# Patient Record
Sex: Female | Born: 1965 | Race: White | Hispanic: No | Marital: Married | Smoking: Never smoker
Health system: Southern US, Community
[De-identification: ages and names within clinical notes are randomized; demographics above are authoritative.]

## PROBLEM LIST (undated history)

## (undated) DIAGNOSIS — C439 Malignant melanoma of skin, unspecified: Secondary | ICD-10-CM

## (undated) DIAGNOSIS — I471 Supraventricular tachycardia, unspecified: Secondary | ICD-10-CM

## (undated) DIAGNOSIS — R7989 Other specified abnormal findings of blood chemistry: Secondary | ICD-10-CM

## (undated) DIAGNOSIS — G43909 Migraine, unspecified, not intractable, without status migrainosus: Secondary | ICD-10-CM

## (undated) DIAGNOSIS — E119 Type 2 diabetes mellitus without complications: Secondary | ICD-10-CM

## (undated) DIAGNOSIS — K769 Liver disease, unspecified: Secondary | ICD-10-CM

## (undated) DIAGNOSIS — R945 Abnormal results of liver function studies: Secondary | ICD-10-CM

## (undated) DIAGNOSIS — F419 Anxiety disorder, unspecified: Secondary | ICD-10-CM

## (undated) DIAGNOSIS — C4362 Malignant melanoma of left upper limb, including shoulder: Secondary | ICD-10-CM

## (undated) DIAGNOSIS — K760 Fatty (change of) liver, not elsewhere classified: Secondary | ICD-10-CM

## (undated) DIAGNOSIS — K589 Irritable bowel syndrome without diarrhea: Secondary | ICD-10-CM

## (undated) HISTORY — PX: MOLE REMOVAL: SHX2046

## (undated) HISTORY — DX: Fatty (change of) liver, not elsewhere classified: K76.0

## (undated) HISTORY — DX: Other specified abnormal findings of blood chemistry: R79.89

## (undated) HISTORY — DX: Malignant melanoma of skin, unspecified: C43.9

## (undated) HISTORY — DX: Liver disease, unspecified: K76.9

## (undated) HISTORY — DX: Type 2 diabetes mellitus without complications: E11.9

## (undated) HISTORY — DX: Abnormal results of liver function studies: R94.5

## (undated) HISTORY — PX: APPENDECTOMY: SHX54

## (undated) HISTORY — DX: Irritable bowel syndrome, unspecified: K58.9

## (undated) HISTORY — PX: TUBAL LIGATION: SHX77

## (undated) HISTORY — PX: OTHER SURGICAL HISTORY: SHX169

---

## 2002-05-27 ENCOUNTER — Encounter: Payer: Self-pay | Admitting: Internal Medicine

## 2002-05-27 ENCOUNTER — Encounter: Admission: RE | Admit: 2002-05-27 | Discharge: 2002-05-27 | Payer: Self-pay | Admitting: Internal Medicine

## 2003-05-29 ENCOUNTER — Encounter: Admission: RE | Admit: 2003-05-29 | Discharge: 2003-05-29 | Payer: Self-pay | Admitting: Internal Medicine

## 2003-05-29 ENCOUNTER — Encounter: Payer: Self-pay | Admitting: Internal Medicine

## 2004-12-01 HISTORY — PX: BREAST SURGERY: SHX581

## 2004-12-30 ENCOUNTER — Encounter: Admission: RE | Admit: 2004-12-30 | Discharge: 2004-12-30 | Payer: Self-pay | Admitting: Internal Medicine

## 2005-06-16 ENCOUNTER — Encounter: Admission: RE | Admit: 2005-06-16 | Discharge: 2005-06-16 | Payer: Self-pay | Admitting: Internal Medicine

## 2005-06-26 IMAGING — CT CT HEAD WO/W CM
1 of 2 series · 13 of 30 positions shown, 17 images · IV contrast (agent unspecified)
Comparison: None.

CLINICAL DATA: Headaches.

CRANIAL CT WITHOUT AND WITH CONTRAST 12/30/2004:
TECHNIQUE: 5 mm axial images were obtained from the skull base through the brain
to the vertex both before and after the administration of intravenous contrast.
Contrast: 75 cc [id number].

[Series 2: brain · axial · 0.49mm/px · z∈[+12,+134]mm · 13 of 28 slices shown, 17 images]
[im 2/28  brain]
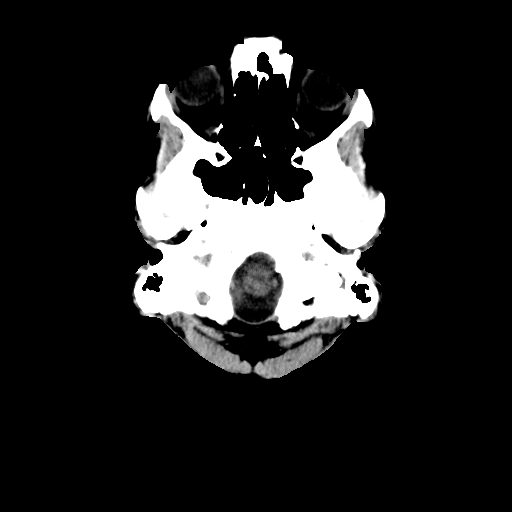
[im 2/28  bone]
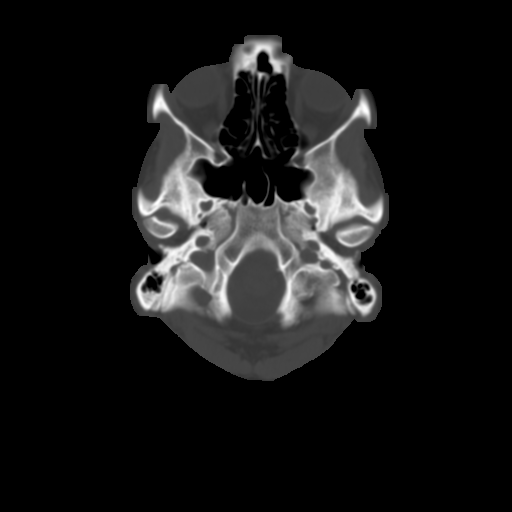
[im 4/28  brain]
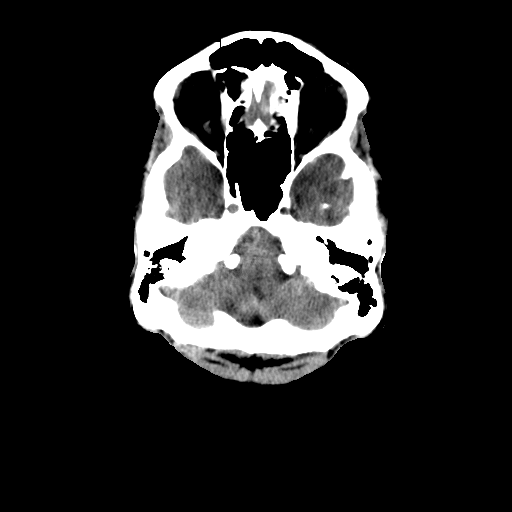
[im 6/28  brain]
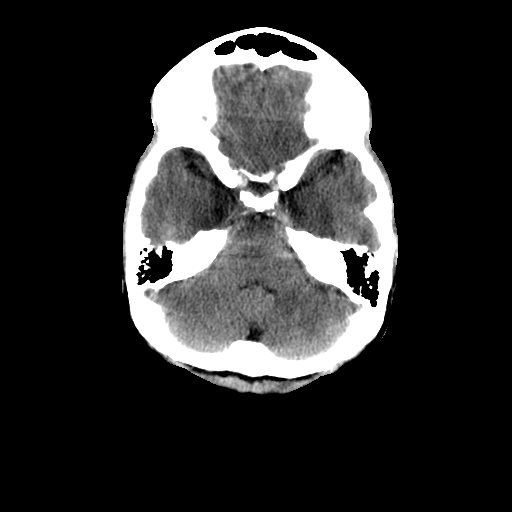
[im 8/28  brain]
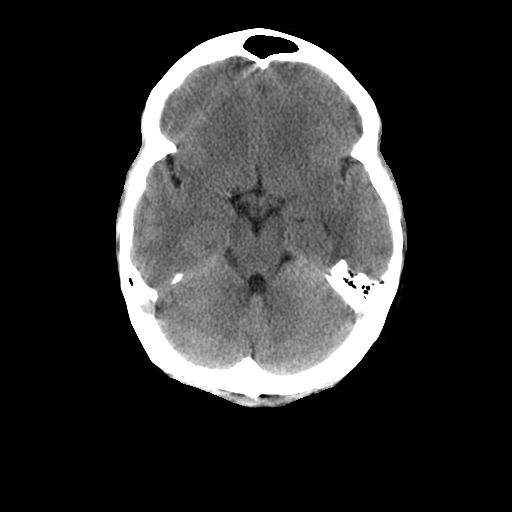
[im 10/28  brain]
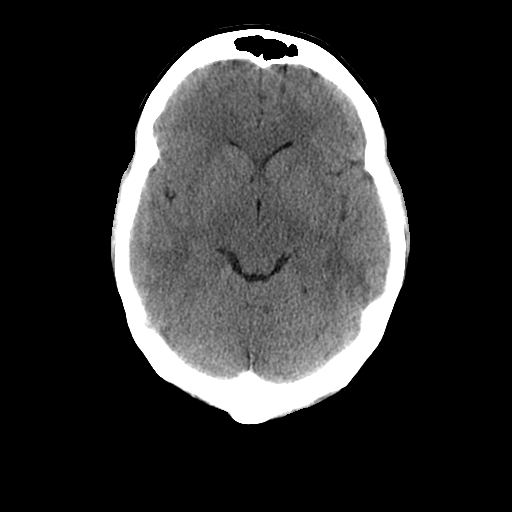
[im 10/28  bone]
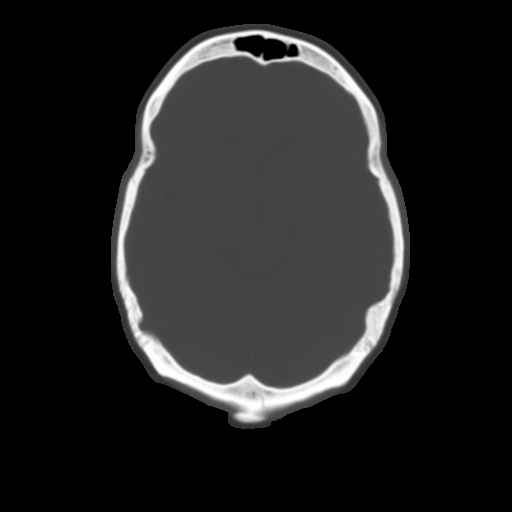
[im 12/28  brain]
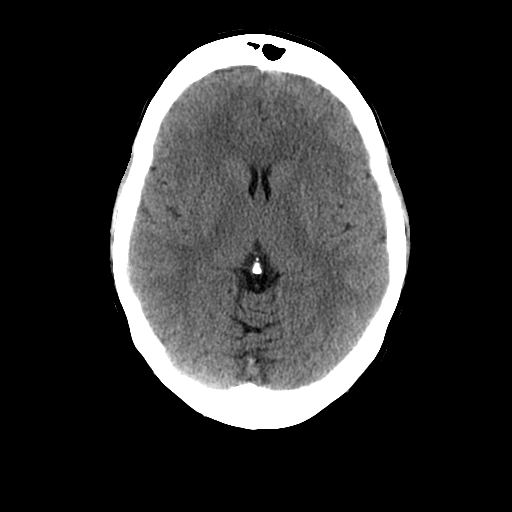
[im 14/28  brain]
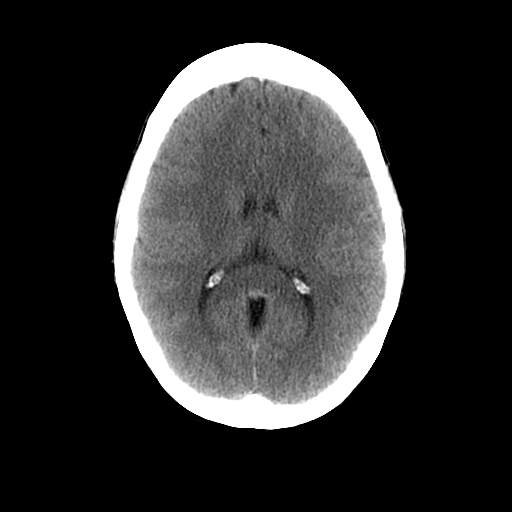
[im 16/28  brain]
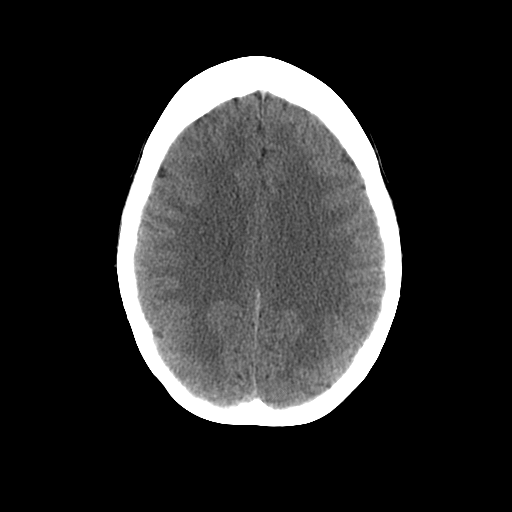
[im 18/28  brain]
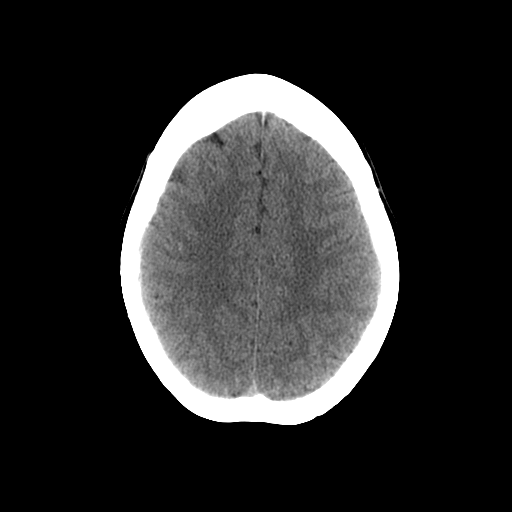
[im 18/28  bone]
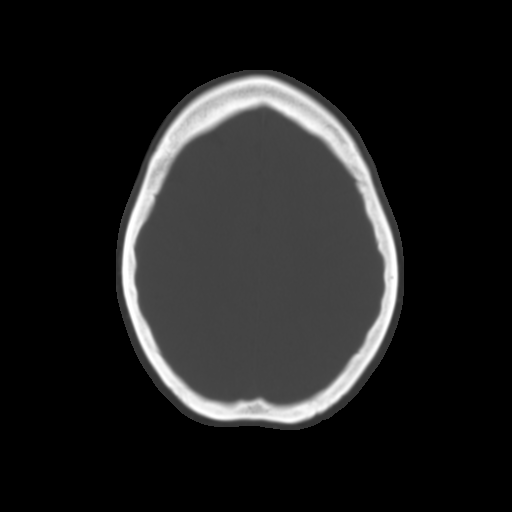
[im 20/28  brain]
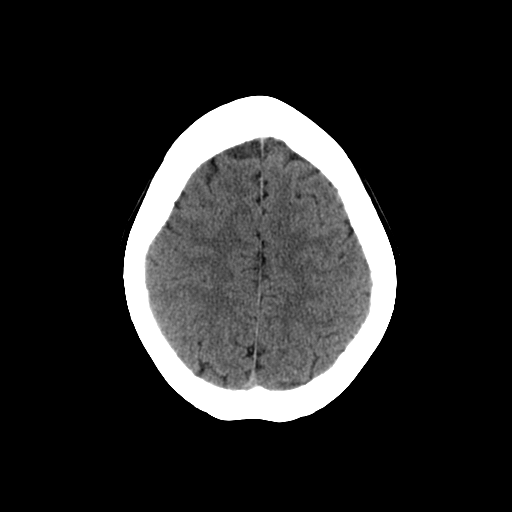
[im 22/28  brain]
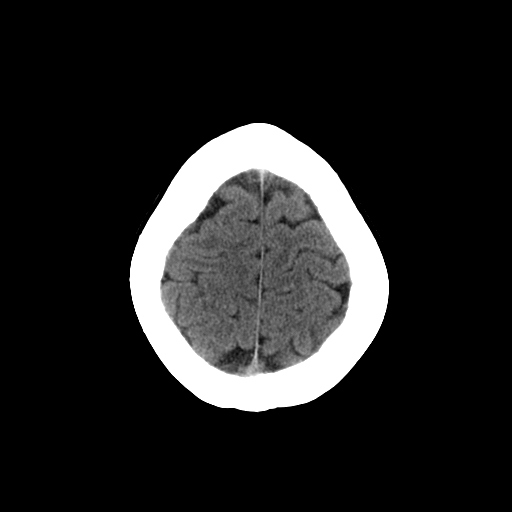
[im 24/28  brain]
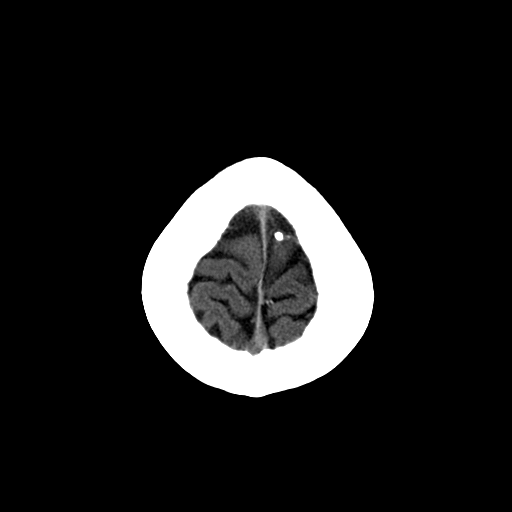
[im 26/28  brain]
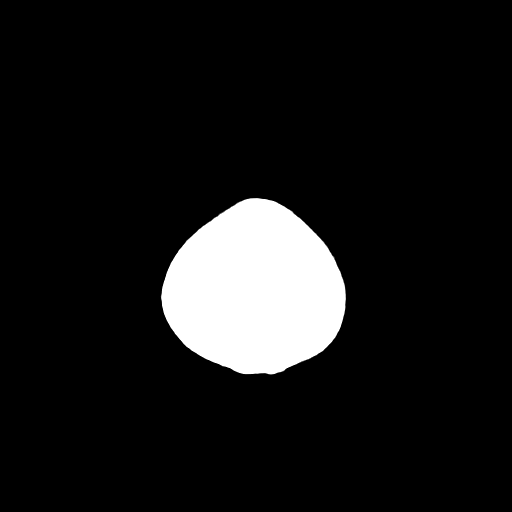
[im 26/28  bone]
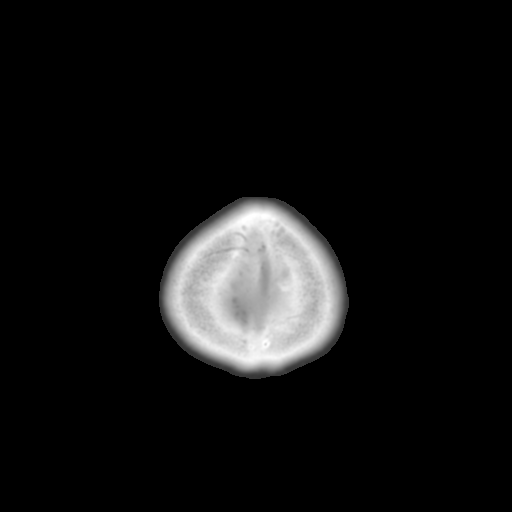

[13 of 30 positions shown; findings below may reference images not displayed]

FINDINGS: The ventricular system is normal in size and appearance for age.
There is no mass-effect or midline shift. There is no hemorrhage or hematoma. No
extra-axial fluid collections are identified. The post-contrast images
demonstrate no abnormal enhancement. I see no focal brain parenchymal
abnormalities.

The bone window images demonstrate no focal osseous abnormalities involving the
skull. The visualized paranasal sinuses and the mastoid air cells appear
well-aerated.
IMPRESSION: Normal cranial CT without and with contrast.

## 2005-12-01 HISTORY — PX: AUGMENTATION MAMMAPLASTY: SUR837

## 2006-09-12 ENCOUNTER — Emergency Department (HOSPITAL_COMMUNITY): Admission: EM | Admit: 2006-09-12 | Discharge: 2006-09-12 | Payer: Self-pay | Admitting: Family Medicine

## 2007-06-18 ENCOUNTER — Encounter: Admission: RE | Admit: 2007-06-18 | Discharge: 2007-06-18 | Payer: Self-pay | Admitting: Internal Medicine

## 2007-06-28 ENCOUNTER — Encounter: Admission: RE | Admit: 2007-06-28 | Discharge: 2007-06-28 | Payer: Self-pay | Admitting: Internal Medicine

## 2008-07-04 ENCOUNTER — Encounter: Admission: RE | Admit: 2008-07-04 | Discharge: 2008-07-04 | Payer: Self-pay | Admitting: Internal Medicine

## 2008-09-30 ENCOUNTER — Observation Stay (HOSPITAL_COMMUNITY): Admission: EM | Admit: 2008-09-30 | Discharge: 2008-10-01 | Payer: Self-pay | Admitting: *Deleted

## 2008-09-30 ENCOUNTER — Encounter: Payer: Self-pay | Admitting: Internal Medicine

## 2008-10-02 ENCOUNTER — Encounter: Payer: Self-pay | Admitting: Internal Medicine

## 2008-10-06 ENCOUNTER — Observation Stay (HOSPITAL_COMMUNITY): Admission: EM | Admit: 2008-10-06 | Discharge: 2008-10-06 | Payer: Self-pay | Admitting: Emergency Medicine

## 2008-10-10 ENCOUNTER — Encounter: Payer: Self-pay | Admitting: Internal Medicine

## 2008-10-24 ENCOUNTER — Encounter: Payer: Self-pay | Admitting: Internal Medicine

## 2008-11-10 ENCOUNTER — Encounter: Payer: Self-pay | Admitting: Internal Medicine

## 2008-12-17 ENCOUNTER — Emergency Department (HOSPITAL_COMMUNITY): Admission: EM | Admit: 2008-12-17 | Discharge: 2008-12-17 | Payer: Self-pay | Admitting: Family Medicine

## 2009-02-09 ENCOUNTER — Encounter: Admission: RE | Admit: 2009-02-09 | Discharge: 2009-02-09 | Payer: Self-pay | Admitting: Internal Medicine

## 2009-03-14 ENCOUNTER — Inpatient Hospital Stay (HOSPITAL_BASED_OUTPATIENT_CLINIC_OR_DEPARTMENT_OTHER): Admission: RE | Admit: 2009-03-14 | Discharge: 2009-03-14 | Payer: Self-pay | Admitting: Cardiology

## 2009-03-27 ENCOUNTER — Encounter: Admission: RE | Admit: 2009-03-27 | Discharge: 2009-03-27 | Payer: Self-pay | Admitting: Internal Medicine

## 2009-03-27 DIAGNOSIS — K760 Fatty (change of) liver, not elsewhere classified: Secondary | ICD-10-CM

## 2009-03-27 HISTORY — DX: Fatty (change of) liver, not elsewhere classified: K76.0

## 2009-05-03 DIAGNOSIS — R079 Chest pain, unspecified: Secondary | ICD-10-CM | POA: Insufficient documentation

## 2009-05-03 DIAGNOSIS — R002 Palpitations: Secondary | ICD-10-CM | POA: Insufficient documentation

## 2009-05-03 DIAGNOSIS — F411 Generalized anxiety disorder: Secondary | ICD-10-CM | POA: Insufficient documentation

## 2009-05-03 DIAGNOSIS — I471 Supraventricular tachycardia: Secondary | ICD-10-CM | POA: Insufficient documentation

## 2009-05-04 ENCOUNTER — Ambulatory Visit: Payer: Self-pay | Admitting: Internal Medicine

## 2009-07-05 ENCOUNTER — Telehealth: Payer: Self-pay | Admitting: Internal Medicine

## 2009-07-06 ENCOUNTER — Encounter: Admission: RE | Admit: 2009-07-06 | Discharge: 2009-07-06 | Payer: Self-pay | Admitting: Internal Medicine

## 2009-07-24 ENCOUNTER — Telehealth: Payer: Self-pay | Admitting: Internal Medicine

## 2009-10-11 ENCOUNTER — Telehealth: Payer: Self-pay | Admitting: Internal Medicine

## 2009-12-01 DIAGNOSIS — C439 Malignant melanoma of skin, unspecified: Secondary | ICD-10-CM

## 2009-12-01 HISTORY — DX: Malignant melanoma of skin, unspecified: C43.9

## 2010-07-15 ENCOUNTER — Ambulatory Visit: Payer: Self-pay | Admitting: Internal Medicine

## 2010-07-17 ENCOUNTER — Encounter: Admission: RE | Admit: 2010-07-17 | Discharge: 2010-07-17 | Payer: Self-pay | Admitting: Internal Medicine

## 2010-12-01 DIAGNOSIS — K769 Liver disease, unspecified: Secondary | ICD-10-CM

## 2010-12-01 HISTORY — DX: Liver disease, unspecified: K76.9

## 2010-12-21 ENCOUNTER — Encounter: Payer: Self-pay | Admitting: Internal Medicine

## 2010-12-22 ENCOUNTER — Encounter: Payer: Self-pay | Admitting: Internal Medicine

## 2010-12-31 NOTE — Assessment & Plan Note (Signed)
Summary: f1y/jss   Visit Type:  Follow-up Referring Provider:  Corliss Marcus, MD Primary Provider:  Waynard Edwards, MD   History of Present Illness: The patient presents today for routine electrophysiology followup. She reports doing very well since last being seen in our clinic.  She has not had SVT since 8/10.  She feels that her anxiety has significantly improved with lexapro.  She has found that treatment of anxiety has improved atypical chest pain. The patient denies symptoms of palpitations, chest pain, shortness of breath, orthopnea, PND, lower extremity edema, dizziness, presyncope, syncope, or neurologic sequela. The patient is tolerating medications without difficulties and is otherwise without complaint today.   Current Medications (verified): 1)  Atenolol 25 Mg Tabs (Atenolol) .... As Needed 2)  Alprazolam .... As Needed 3)  Frova 2.5 Mg Tabs (Frovatriptan Succinate) .... As Needed 4)  Topamax 25 Mg Tabs (Topiramate) .... 4 Once Daily 5)  Ibuprofen 600 Mg Tabs (Ibuprofen) .... As Needed 6)  Valtrex 1 Gm Tabs (Valacyclovir Hcl) .... As Needed 7)  Multivitamins   Tabs (Multiple Vitamin) .Marland Kitchen.. 1 By Mouth Once Daily 8)  Riboflavin 100 Mg Caps (Riboflavin) .... 400mg  Once Daily 9)  Magnesium 500 Mg Caps (Magnesium Oxide) .Marland Kitchen.. 1 By Mouth Once Daily 10)  Lexapro 10 Mg Tabs (Escitalopram Oxalate) .... Once Daily 11)  Baclofen 10 Mg Tabs (Baclofen) .... As Needed 12)  Flexeril 10 Mg Tabs (Cyclobenzaprine Hcl) .... As Needed 13)  Loseasonique 0.1-0.02 & 0.01 Mg Tabs (Levonorgest-Eth Estrad 91-Day) .... Uad 14)  Vitamin D 1000 Unit Tabs (Cholecalciferol) .... Once Daily  Allergies: 1)  ! * No Ivp Dye Allergy 2)  ! * No Latex Allergy 3)  ! * No Shellfish Allergy  Past History:  Past Medical History: Reviewed history from 05/04/2009 and no changes required. SVT 09/30/08 ANXIETY Atypical chest pain S/p LHC 4/10 showing normal coronary arteries, nl ef Migraines  Past Surgical  History: Reviewed history from 05/04/2009 and no changes required. breast augmentation 2007 C section x 2 (1990, 1993)  Social History: Reviewed history from 05/04/2009 and no changes required. Lives in [place].  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  Alcohol Use -  rare Drug Use - no  Vital Signs:  Patient profile:   45 year old female Height:      69 inches Weight:      189 pounds BMI:     28.01 Pulse rate:   76 / minute BP sitting:   120 / 88  (left arm)  Vitals Entered By: Laurance Flatten CMA (July 15, 2010 4:55 PM)  Physical Exam  General:  Well developed, well nourished, in no acute distress. Head:  normocephalic and atraumatic Eyes:  PERRLA/EOM intact; conjunctiva and lids normal. Mouth:  Teeth, gums and palate normal. Oral mucosa normal. Neck:  Neck supple, no JVD. No masses, thyromegaly or abnormal cervical nodes. Lungs:  Clear bilaterally to auscultation and percussion. Heart:  Non-displaced PMI, chest non-tender; regular rate and rhythm, S1, S2 without murmurs, rubs or gallops. Carotid upstroke normal, no bruit. Normal abdominal aortic size, no bruits. Femorals normal pulses, no bruits. Pedals normal pulses. No edema, no varicosities. Abdomen:  Bowel sounds positive; abdomen soft and non-tender without masses, organomegaly, or hernias noted. No hepatosplenomegaly. Msk:  Back normal, normal gait. Muscle strength and tone normal. Pulses:  pulses normal in all 4 extremities Extremities:  No clubbing or cyanosis. Neurologic:  Alert and oriented x 3.   EKG  Procedure  date:  07/15/2010  Findings:      sinus rhythnm 76 bpm, PR 156, nonspecific ST/T changes  Impression & Recommendations:  Problem # 1:  SVT/ PSVT/ PAT (ICD-427.0) stable off medicine pt wishes to defer ablation she will take atenolol 25mg  as needed for SVT  Problem # 2:  CHEST PAIN-UNSPECIFIED (ICD-786.50) improved with treatment of anxiety  Problem # 3:   ANXIETY (ICD-300.00) improved with lexapro  Patient Instructions: 1)  Your physician recommends that you schedule a follow-up appointment in: 12 months with Dr Johney Frame 2)  Your physician recommends that you continue on your current medications as directed. Please refer to the Current Medication list given to you today. Prescriptions: ATENOLOL 25 MG TABS (ATENOLOL) as needed  #30 x 3   Entered by:   Dennis Bast, RN, BSN   Authorized by:   Hillis Range, MD   Signed by:   Dennis Bast, RN, BSN on 07/15/2010   Method used:   Electronically to        [facility]* (retail)       [address] [place], [place]  [address]       Ph: [phone] or [phone]       Fax: [phone]   RxID:   [id number]

## 2011-04-15 NOTE — H&P (Signed)
Alexa [name], Alexa [name]                 ACCOUNT NO.:  [id number]   MEDICAL RECORD NO.:  [id number]          PATIENT TYPE:  OBV   LOCATION:  2029                         FACILITY:  [facility]   PHYSICIAN:  Ladell Pier, M.D.   DATE OF BIRTH:  09/05/1966   DATE OF ADMISSION:  10/05/2008  DATE OF DISCHARGE:                              HISTORY & PHYSICAL   CHIEF COMPLAINT:  Chest pain and dizziness.   HISTORY OF PRESENT ILLNESS:  The patient is a 45 year old white female  who presented to the emergency room with chest pain, dizziness, and left  arm numbness.  The patient stated that 5 days ago she was in the  hospital. She was diagnosed with SVT with her heart rate going into the  190's and 170s.  She saw Dr. Amil Amen.  She followed up 4 days ago for a  2-D echo that was normal.  She was placed on atenolol for the SVT.  The  patient stated that she just has not been feeling right lately.  She  states that she has these episodes where her heart is not racing any  more, but she has episodes as if she is going to pass out.  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  Now she has tightness in the center of her chest that radiates  over towards the left arm and her left arm feels in all the way up to  her chest.  She states there is a lot of pressure in the center of her  chest going more over to the left side.  She stated that with the chest  pressure it has lasted all day and she had some diaphoresis with it at  one time but no nausea or vomiting.  She does get short of breath.   PAST MEDICAL HISTORY:  Significant for migraine headaches, recently  diagnosed SVT. She has had 2 C sections, and she has had breast  augmentation in 2007.   FAMILY HISTORY:  Mother has osteoarthritis.  She is 70.  Father is 46  and had heart disease and Afib.   SOCIAL HISTORY:  She does not smoke.  She drinks alcohol socially.  She  is married.  She has two children, 15  and 19.  She is a Engineer, site.  No IV drug use.   MEDICATIONS:  1. Ibuprofen 600 mg p.r.n.  2. Valium 5 mg daily as needed.  3. Loestrin 1/20  oral contraceptives  4. Topamax 50 mg twice daily.  5. Imitrex 100 mg as needed.  6. Atenolol 50 mg daily.  7. Vicodin 10 mg as needed.   ALLERGIES:  No known drug allergies.   REVIEW OF SYSTEMS:  Negative otherwise stated in HPI.   PHYSICAL EXAM:  VITAL SIGNS:  Temperature 97.8, blood pressure 106/60,  pulse of 70, respirations 20, pulse ox 100%.  GENERAL: The patient is sitting on the stretcher, does not seem to be in  any acute distress.  Her husband is with her and mother and father.  HEENT: Normocephalic, atraumatic.  Pupils are equal, round, and reactive  to light. Throat without erythema.  CARDIOVASCULAR:  Regular rate and rhythm.  No murmurs, rubs or gallops.  No carotid bruits.  LUNGS: Clear bilaterally.  No wheeze, rhonchi, or rales.  ABDOMEN:  Soft, nontender, nondistended.  Positive bowel sounds.  EXTREMITIES: Without edema, 2+ DP pulse bilaterally.  NEUROLOGICAL:  Exam is nonfocal.   LABS:  Myoglobin 58, MB 1.2, troponin less than 0.05.  UA 3-6 WBCs, 0-2  RBCs, few bacteria.  Urinalysis trace leukocyte esterase. Magnesium 2.3.  Sodium 136, potassium 4, chloride 108, CO2 of 17, glucose 117, BUN 12,  creatinine 0.68, calcium 9.2. WBC 8.9, hemoglobin 15.  Platelets are  clumped.  D-dimer 0.29.  Head CT negative for evidence of acute infarct.  Chest x-ray negative for any acute findings. The patient's CT scan of  the chest is pending. The EKG question of anterior infarct, but  unchanged from previous EKG.   ASSESSMENT/PLAN:  1. Migraine headaches, none at present.  2. Supraventricular tachycardia.  3. Dizziness and chest pain with left arm numbness.  4. Question of urinary tract infection.  5. Question of metabolic acidosis.   Will admit the patient to the hospital and start her on KVO IV fluids,  get cardiac markers,  TSH, fasting lipid panel.  Continue the atenolol,  get a cardiology consult, and Dr. Amil Amen will see the patient in the  morning.      Ladell Pier, M.D.  Electronically Signed     NJ/MEDQ  D:  10/05/2008  T:  10/06/2008  Job:  [id number]   cc:   Francisca December, M.D.  Robertd, MD

## 2011-04-15 NOTE — Discharge Summary (Signed)
NAMESTEFFI, NOVIELLO                 ACCOUNT NO.:  [id number]   MEDICAL RECORD NO.:  [id number]          PATIENT TYPE:  OBV   LOCATION:  2029                         FACILITY:  [facility]   PHYSICIAN:  Marcellus Scott, MD     DATE OF BIRTH:  [birth year]   DATE OF ADMISSION:  10/05/2008  DATE OF DISCHARGE:  10/06/2008                               DISCHARGE SUMMARY   PRIMARY MEDICAL DOCTOR'S:  Antony Madura, M.D.   PRIMARY CARDIOLOGIST:  Francisca December, M.D. of [facility].   DISCHARGE DIAGNOSES:  1. Atypical chest discomfort.  MI (myocardial infarction) and PE      (pulmonary embolus) ruled out.  2. History of supraventricular tachycardia.  3. History of migraine.  4. Fatty liver with mildly abnormal liver function test.  5. Urinary tract infection/uncomplicated cystitis.   DISCHARGE MEDICATIONS:  1. Cipro 500 mg p.o. b.i.d. for 3 days.  2. Ibuprofen 600 mg p.o. 3 times a day with meals p.r.n.  3. Valium 5 mg p.o. daily p.r.n.  4. Oral contraceptive pills Loestrin 1/20.  5. Topamax 50 mg p.o. b.i.d.  6. Imitrex 100 mg p.o. p.r.n.  7. Atenolol 50 mg p.o. daily.  8. Baclofen 10 mg p.o. p.r.n.   PROCEDURES:  1. CT angiogram of the chest on October 05, 2008, impression - no      evidence of pulmonary embolism; no acute process in the chest,      fatty infiltration of the liver.  2. Chest x-ray on October 05, 2008, impression - negative.  3. CT of the head without contrast on October 05, 2008, impression -      negative.   PERTINENT LABS:  Cardiac enzymes cycled and negative.  Comprehensive  metabolic panel remarkable for BUN 9, creatinine 0.86, AST 94, ALT 86,  TSH 1.988, CBCs within normal limits, hemoglobin 14.1, hematocrit 42.4,  white blood cell 6.7, platelets 264.  Lipid panel within normal limits  with cholesterol 150, triglycerides 91, HDL 49, LDL 83, VLDL 18, D-dimer  negative.  Urinalysis with 3-6 white blood cells per high-power field  and a few bacteria.   CONSULTATIONS:  Cardiology, Dr. Anne Fu of [facility].   HOSPITAL COURSE AND PATIENT DISPOSITION:  Ms. 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  She was discharged home on oral atenolol.  She had an echocardiogram at  her cardiologist's office early this week which was said to be normal.  She indicates that since she was discharged she has been unwell with  intermittent episodes of feeling weak, dizzy.  Since yesterday afternoon  while at work she experienced anterior chest pressure with radiation to  left upper extremity with left upper extremity numbness, dizziness and  feeling like she was going to pass out.  She denies any history of  palpitations.  She thereby came to the emergency room and was admitted  for observation and evaluation.   1. Atypical chest pressure.  The patient was admitted to telemetry  with no arrhythmia alarms.  Her cardiac enzymes were cycled and      negative.  PE was ruled out.  The patient indicates that she has      minimal intermittent chest pressure but no more numbness of the      left upper extremity.  Her EKG this morning is normal sinus rhythm      with no acute or chronic ischemic changes..  The patient has no      history of heavy alcohol intake or smoking.  She does have family      history of coronary artery disease.  Her father underwent coronary      artery bypass graft at age 65.  Cardiology was consulted who have      cleared her for discharge and have arranged for an outpatient      stress test on October 10, 2008, at 12 p.m.  The patient will also      be provided with a 30 day event monitor to monitor for arrhythmias.  2. History of supraventricular tachycardia.  No further events on      monitoring in the hospital.  The patient is to continue her      atenolol.  3. History of migraines.  No headache on  this admission.  4. Fatty liver with slightly abnormal liver function tests.  To be      evaluated as an outpatient as deemed necessary.  5. Presumed urinary tract infection.  The patient indicates she has      frequency of urine but denies dysuria or chills.  Will treat with a      3 day course of ciprofloxacin.      Marcellus Scott, MD  Electronically Signed     AH/MEDQ  D:  10/06/2008  T:  10/06/2008  Job:  [id number]   cc:   Jake Bathe, MD  Antony Madura, M.D.  Francisca December, M.D.

## 2011-04-15 NOTE — Cardiovascular Report (Signed)
Alexa, [name]                 ACCOUNT NO.:  [id number]   MEDICAL RECORD NO.:  [id number]          PATIENT TYPE:  OIB   LOCATION:  1962                         FACILITY:  [facility]   PHYSICIAN:  Francisca December, M.D.  DATE OF BIRTH:  12-17-1965   DATE OF PROCEDURE:  03/14/2009  DATE OF DISCHARGE:  03/14/2009                            CARDIAC CATHETERIZATION   PROCEDURES PERFORMED:  1. Left heart catheterization.  2. Coronary angiography.  3. Left ventriculogram.   INDICATIONS:  Alexa [name] is a 45 year old woman who has had persistent  left and substernal chest discomfort both with and without exertion.  It  has been at times resolved with sublingual nitroglycerin, but also rest  and anxiolytics have helped.  She has a strong family history of  coronary disease.  A myocardial perfusion study with exercise stress in  November 2009 was normal.  However, chest discomfort has persisted.  Therefore, brought to the Cardiac Catheterization Laboratory at this  time to identify the extent of disease and provide further therapeutic  options.   PROCEDURE NOTE:  The patient was brought to Cardiac Catheterization  Laboratory in a fasting state.  The right groin was prepped and draped  in the usual sterile fashion.  Local anesthesia was obtained with  infiltration of 1% lidocaine.  A 4-French catheter sheath was inserted  percutaneously into the right femoral artery utilizing an anterior  approach over a guiding J-wire.  Left heart catheterization and coronary  angiography then proceeded in a standard fashion using 4-French #4 left  and right Judkins catheters and a 110-cm pigtail catheter.  A 30-degree  RAO cine left ventriculogram was performed utilizing power injector and  36 mL of contrast were injected at 12 mL/second.  Following the  sublingual administration of 0.4 mg nitroglycerin, cine angiography of  the right and left coronary arteries were conducted in multiple LAO and  RAO  projections.  At the completion of the procedure, the catheter and  catheter sheaths were removed.  Hemostasis was achieved by direct  pressure.  The patient was transported to the recovery area in stable  condition with an intact distal pulse.   HEMODYNAMIC RESULTS:  Systemic arterial pressure was 116/75 with a mean  of 94 mmHg.  There was no systolic gradient across the aortic valve.  The left ventricular end-diastolic pressure was 17 mmHg pre-  ventriculogram.   ANGIOGRAPHY:  The left ventriculogram demonstrated normal chamber size  and normal global systolic function without regional wall motion  abnormality.  A visual estimated ejection fraction is 65%.  There is no  coronary calcification seen.  There is no mitral regurgitation.  The  aortic valve is trileaflet and opens normally during systole.   There is a right-dominant coronary system present.  The main left  coronary artery is normal.   The left anterior descending artery and its branches are normal; there  is a large first diagonal branch which is the dominant vessel on the  anterolateral wall of the heart.  The ongoing anterior descending  reaches and traverses the apex.  There are no obstructions  seen  whatsoever in the LAD system.   The left circumflex coronary and its branches are without obstruction.  This vessel amounts to two moderate-sized marginal branches again  without any obstruction.   The right coronary and its branches are normal.  This is a large  dominant vessel with a large posterior descending artery and a large  posterolateral segment with a single large left ventricular branch.   Collateral vessels are not seen.   FINAL IMPRESSION:  1. Intact ventricular size and global systolic function, ejection      fraction 65%.  2. Normal coronary arteries.   PLAN/RECOMMENDATIONS:  The patient is presented with this gratifying  news.  Her chest pain clearly is not coronary ischemic in nature and she  has  an excellent prognosis.  We will continue medical therapy.      Francisca December, M.D.  Electronically Signed     JHE/MEDQ  D:  03/14/2009  T:  03/15/2009  Job:  [id number]   cc:   Antony Madura, M.D.

## 2011-04-15 NOTE — Consult Note (Signed)
Alexa [name], Alexa [name]                 ACCOUNT NO.:  [id number]   MEDICAL RECORD NO.:  [id number]          PATIENT TYPE:  OBV   LOCATION:  2029                         FACILITY:  [facility]   PHYSICIAN:  Jake Bathe, MD      DATE OF BIRTH:  1966-08-29   DATE OF CONSULTATION:  10/06/2008  DATE OF DISCHARGE:  10/06/2008                                 CONSULTATION   REASON FOR CONSULTATION:  Chest pain.   HISTORY OF PRESENT ILLNESS:  Ms. Alexa [name] is a 45 year old female who was  admitted over this past weekend complaining of palpitations.  She had  documented SVT (question atrial tachycardia).  She was placed on  atenolol 50 mg a day and has had no further reoccurrence of her SVT.  At  that point, she was kept overnight.  She ruled out for myocardial  infarction and was discharged to home with an echo planned for the next  day.   In speaking with Dr. Amil Amen, the echo was normal.  Yesterday, she had a  strange feeling and numbness all over including weakness and it sounds  like she was presyncopal. Lasted several hours in duration.  Just did  not feel right.  She denies any palpitations, no full syncope.  She did  have some chest discomfort that felt like a band across her chest.  She  denies any pleuritic component.  No palpitations.  She has no prior  history of significant anxiety.   PAST MEDICAL HISTORY:  Migraines.   SOCIAL HISTORY:  She lives in [place] with her husband, Theatre stage manager.  She is a Engineer, site.  She denies any tobacco, alcohol, or illicit  drug use.   FAMILY HISTORY:  Mother had osteoarthritis.  Father had bypass surgery  in his 65s.   ALLERGIES:  No known drug allergies.   MEDICATIONS:  1. Atenolol 50 mg a day.  2. Valium p.r.n.  3. Loestrin daily.  4. Topamax 50 mg b.i.d.  5. Imitrex p.r.n.  6. Vicodin p.r.n.   REVIEW OF SYSTEMS:  As above, otherwise 12 ROS negative. No frank  syncope, unilateral stroke like symptoms. No fevers, recent travel.   PHYSICAL EXAMINATION:  VITAL SIGNS:  Temperature 98.4, pulse 71,  respirations 18, blood pressure 94/61, and O2 saturations 98% on room  air.  GENERAL:  She is in no acute distress.  HEENT:  Grossly normal.  Sclerae clear.  Conjunctivae normal.  Nares  without drainage.  NECK:  No carotid or subclavian bruits.  No JVD or thyromegaly.  CHEST:  Clear to auscultation bilaterally.  No wheezing or rhonchi.  HEART:  Rate and rhythm regular.  No gallops or murmur.  ABDOMEN:  Good bowel sounds, nontender, and nondistended.  No  hepatosplenomegaly.  No bruits.  EXTREMITIES:  No peripheral edema.  SKIN:  Warm and dry.  NEUROLOGIC:  Cranial nerves II through XII grossly intact.  Normal mood  and affect.   LABORATORY DATA:  Chest CT showed no pulmonary embolism.  CT of the head  was negative.  Chest x-ray was normal.  Lab studies showed  hemoglobin  15, hematocrit 43.5, platelets were clumped, and white count 8.9.  Sodium 136, potassium 4.0, BUN 12, creatinine 0.68, and glucose 177.  Point-of-care markers negative x1.  Urinalysis did have some white blood  cells.  Magnesium 2.1.  D-dimer 0.29.   EKG shows normal sinus rhythm rate 69, within normal limits; no change  from prior.  Prior medical records reviewed.   ASSESSMENT AND PLAN:  1. Supraventricular tachycardia, recent admission for this.  There is      no further documented supraventricular tachycardia during this      hospitalization.  2. Chest pain, ruled out by enzymes/ ECG.  3. Migraines.   We will perform a stress Cardiolite next week in the office.  We will  also arrange for a 30-day event monitor given her recent SVT and  presyncope.  Extensive workup thus far, including neurologic, has been  unremakable except for SVT as above.  It was discussed with her and  husband that anxiety is possibly playing a role.  In the meantime, she  is to remain on her atenolol 50 mg a day.  She is instructed to stop her  atenolol on Sunday, so  we can do a stress test on Tuesday.  She is to  call if she has any further problems.  The patient was seen and examined  by Dr. Donato Schultz.      Guy Franco, P.A.      Jake Bathe, MD  Electronically Signed    LB/MEDQ  D:  10/06/2008  T:  10/06/2008  Job:  [id number]   cc:   Francisca December, M.D.

## 2011-07-07 ENCOUNTER — Other Ambulatory Visit: Payer: Self-pay | Admitting: Internal Medicine

## 2011-07-07 DIAGNOSIS — Z1231 Encounter for screening mammogram for malignant neoplasm of breast: Secondary | ICD-10-CM

## 2011-08-25 ENCOUNTER — Ambulatory Visit
Admission: RE | Admit: 2011-08-25 | Discharge: 2011-08-25 | Disposition: A | Discharge status: Home / Self Care | Payer: BC Managed Care – PPO | Source: Ambulatory Visit | Attending: Internal Medicine | Admitting: Internal Medicine

## 2011-08-25 DIAGNOSIS — Z1231 Encounter for screening mammogram for malignant neoplasm of breast: Secondary | ICD-10-CM

## 2011-09-01 LAB — CBC
HCT: 43.2
Hemoglobin: 14.3
MCHC: 33.2
MCV: 91.3
RDW: 12.8
WBC: 7.8

## 2011-09-01 LAB — TROPONIN I: Troponin I: <0.01

## 2011-09-01 LAB — BASIC METABOLIC PANEL
BUN: 13
Calcium: 9.1
Creatinine, Ser: 0.84
Potassium: 4
Sodium: 137

## 2011-09-01 LAB — DIFFERENTIAL
Basophils Absolute: 0
Eosinophils Absolute: 0

## 2011-09-01 LAB — RAPID URINE DRUG SCREEN, HOSP PERFORMED
Amphetamines: NOT DETECTED
Barbiturates: NOT DETECTED
Cocaine: NOT DETECTED
Tetrahydrocannabinol: NOT DETECTED

## 2011-09-01 LAB — CK TOTAL AND CKMB (NOT AT ARMC)
CK, MB: 0.9
Total CK: 73

## 2011-09-01 LAB — ETHANOL: Alcohol, Ethyl (B): <5

## 2011-09-01 LAB — TSH: TSH: 2.693

## 2011-09-02 LAB — URINALYSIS, ROUTINE W REFLEX MICROSCOPIC
Bilirubin Urine: NEGATIVE
Glucose, UA: NEGATIVE
Ketones, ur: NEGATIVE
Protein, ur: NEGATIVE
pH: 6

## 2011-09-02 LAB — CBC
HCT: 42.4
Hemoglobin: 15
MCHC: 34.5
MCV: 88.8
MCV: 90.7
Platelets: 264
RBC: 4.68
RDW: 12.6
WBC: 6.7

## 2011-09-02 LAB — COMPREHENSIVE METABOLIC PANEL
ALT: 86 — ABNORMAL HIGH
AST: 94 — ABNORMAL HIGH
Albumin: 3.6
CO2: 22
Calcium: 9.1
Chloride: 111
Creatinine, Ser: 0.86
GFR calc Af Amer: >60
GFR calc non Af Amer: >60
Sodium: 140
Total Bilirubin: 0.6

## 2011-09-02 LAB — CK TOTAL AND CKMB (NOT AT ARMC)
CK, MB: 0.8
Total CK: 55

## 2011-09-02 LAB — BASIC METABOLIC PANEL
CO2: 17 — ABNORMAL LOW
Calcium: 9.2
Chloride: 108
Creatinine, Ser: 0.68
Glucose, Bld: 117 — ABNORMAL HIGH

## 2011-09-02 LAB — TSH: TSH: 1.988

## 2011-09-02 LAB — POCT CARDIAC MARKERS
CKMB, poc: 1.2
Myoglobin, poc: 58
Troponin i, poc: <0.05

## 2011-09-02 LAB — DIFFERENTIAL
Basophils Absolute: 0.1
Eosinophils Absolute: 0
Lymphocytes Relative: 16
Monocytes Absolute: 0.4
Neutro Abs: 7
Neutrophils Relative %: 79 — ABNORMAL HIGH

## 2011-09-02 LAB — URINE CULTURE: Colony Count: >=100000

## 2011-09-02 LAB — LIPID PANEL
LDL Cholesterol: 83
Total CHOL/HDL Ratio: 3.1
VLDL: 18

## 2011-09-02 LAB — URINE MICROSCOPIC-ADD ON

## 2012-06-07 ENCOUNTER — Observation Stay (HOSPITAL_COMMUNITY): Payer: BC Managed Care – PPO | Admitting: Anesthesiology

## 2012-06-07 ENCOUNTER — Encounter (HOSPITAL_COMMUNITY)
Admission: EM | Disposition: A | Discharge status: Home / Self Care | Payer: Self-pay | Source: Home / Self Care | Attending: Emergency Medicine

## 2012-06-07 ENCOUNTER — Encounter (HOSPITAL_COMMUNITY): Payer: Self-pay | Admitting: Emergency Medicine

## 2012-06-07 ENCOUNTER — Observation Stay (HOSPITAL_COMMUNITY)
Admission: EM | Admit: 2012-06-07 | Discharge: 2012-06-07 | Disposition: A | Discharge status: Home / Self Care | Payer: BC Managed Care – PPO | Attending: Surgery | Admitting: Surgery

## 2012-06-07 ENCOUNTER — Encounter (HOSPITAL_COMMUNITY): Payer: Self-pay | Admitting: Anesthesiology

## 2012-06-07 ENCOUNTER — Emergency Department (HOSPITAL_COMMUNITY): Payer: BC Managed Care – PPO

## 2012-06-07 DIAGNOSIS — G43909 Migraine, unspecified, not intractable, without status migrainosus: Secondary | ICD-10-CM | POA: Insufficient documentation

## 2012-06-07 DIAGNOSIS — Z79899 Other long term (current) drug therapy: Secondary | ICD-10-CM | POA: Insufficient documentation

## 2012-06-07 DIAGNOSIS — K3580 Unspecified acute appendicitis: Secondary | ICD-10-CM

## 2012-06-07 DIAGNOSIS — E669 Obesity, unspecified: Secondary | ICD-10-CM | POA: Insufficient documentation

## 2012-06-07 DIAGNOSIS — Z85820 Personal history of malignant melanoma of skin: Secondary | ICD-10-CM | POA: Insufficient documentation

## 2012-06-07 DIAGNOSIS — F410 Panic disorder [episodic paroxysmal anxiety] without agoraphobia: Secondary | ICD-10-CM | POA: Insufficient documentation

## 2012-06-07 HISTORY — DX: Supraventricular tachycardia, unspecified: I47.10

## 2012-06-07 HISTORY — DX: Migraine, unspecified, not intractable, without status migrainosus: G43.909

## 2012-06-07 HISTORY — DX: Supraventricular tachycardia: I47.1

## 2012-06-07 HISTORY — PX: LAPAROSCOPIC APPENDECTOMY: SHX408

## 2012-06-07 HISTORY — DX: Malignant melanoma of left upper limb, including shoulder: C43.62

## 2012-06-07 HISTORY — DX: Anxiety disorder, unspecified: F41.9

## 2012-06-07 LAB — URINALYSIS, ROUTINE W REFLEX MICROSCOPIC
Bilirubin Urine: NEGATIVE
Glucose, UA: NEGATIVE mg/dL
Hgb urine dipstick: NEGATIVE
Ketones, ur: NEGATIVE mg/dL
Protein, ur: NEGATIVE mg/dL

## 2012-06-07 LAB — CBC WITH DIFFERENTIAL/PLATELET
Basophils Relative: 0 % (ref 0–1)
Eosinophils Absolute: 0 10*3/uL (ref 0.0–0.7)
Eosinophils Relative: 0 % (ref 0–5)
Hemoglobin: 14.5 g/dL (ref 12.0–15.0)
Lymphs Abs: 1.6 10*3/uL (ref 0.7–4.0)
MCH: 30.7 pg (ref 26.0–34.0)
MCHC: 33.9 g/dL (ref 30.0–36.0)
MCV: 90.5 fL (ref 78.0–100.0)
Monocytes Relative: 4 % (ref 3–12)
RBC: 4.73 MIL/uL (ref 3.87–5.11)

## 2012-06-07 LAB — WET PREP, GENITAL
Trich, Wet Prep: NONE SEEN
Yeast Wet Prep HPF POC: NONE SEEN

## 2012-06-07 LAB — BASIC METABOLIC PANEL
BUN: 13 mg/dL (ref 6–23)
Calcium: 8.8 mg/dL (ref 8.4–10.5)
Creatinine, Ser: 0.6 mg/dL (ref 0.50–1.10)
GFR calc Af Amer: >90 mL/min (ref >90)
GFR calc non Af Amer: >90 mL/min (ref >90)
Glucose, Bld: 124 mg/dL — ABNORMAL HIGH (ref 70–99)

## 2012-06-07 LAB — MRSA PCR SCREENING: MRSA by PCR: NEGATIVE

## 2012-06-07 SURGERY — APPENDECTOMY, LAPAROSCOPIC
Anesthesia: General | Site: Abdomen | Wound class: Clean Contaminated

## 2012-06-07 MED ORDER — PIPERACILLIN-TAZOBACTAM 3.375 G IVPB
3.3750 g | Freq: Three times a day (TID) | INTRAVENOUS | Status: DC
  Administered 2012-06-07 (×2): 3.375 g via INTRAVENOUS
  Filled 2012-06-07 (×5): qty 50

## 2012-06-07 MED ORDER — HYDROCODONE-ACETAMINOPHEN 5-325 MG PO TABS: 1.0000 | ORAL_TABLET | ORAL | Status: AC | PRN

## 2012-06-07 MED ORDER — ONDANSETRON HCL 4 MG/2ML IJ SOLN: 4.0000 mg | Freq: Four times a day (QID) | INTRAMUSCULAR | Status: DC | PRN

## 2012-06-07 MED ORDER — PROPOFOL 10 MG/ML IV EMUL
INTRAVENOUS | Status: DC | PRN
  Administered 2012-06-07: 150 mg via INTRAVENOUS

## 2012-06-07 MED ORDER — POLYETHYLENE GLYCOL 3350 17 G PO PACK
17.0000 g | PACK | Freq: Every day | ORAL | Status: DC | PRN
  Filled 2012-06-07: qty 1

## 2012-06-07 MED ORDER — ACETAMINOPHEN 325 MG PO TABS: 650.0000 mg | ORAL_TABLET | Freq: Four times a day (QID) | ORAL | Status: DC | PRN

## 2012-06-07 MED ORDER — IOHEXOL 300 MG/ML  SOLN
100.0000 mL | Freq: Once | INTRAMUSCULAR | Status: AC | PRN
  Administered 2012-06-07: 100 mL via INTRAVENOUS

## 2012-06-07 MED ORDER — ONDANSETRON HCL 4 MG/2ML IJ SOLN
INTRAMUSCULAR | Status: DC | PRN
  Administered 2012-06-07: 4 mg via INTRAVENOUS

## 2012-06-07 MED ORDER — MAGNESIUM HYDROXIDE 400 MG/5ML PO SUSP: 30.0000 mL | Freq: Two times a day (BID) | ORAL | Status: DC | PRN

## 2012-06-07 MED ORDER — MORPHINE SULFATE 2 MG/ML IJ SOLN: 1.0000 mg | INTRAMUSCULAR | Status: DC | PRN

## 2012-06-07 MED ORDER — PROMETHAZINE HCL 25 MG/ML IJ SOLN: 12.5000 mg | Freq: Four times a day (QID) | INTRAMUSCULAR | Status: DC | PRN

## 2012-06-07 MED ORDER — METOPROLOL TARTRATE 12.5 MG HALF TABLET
12.5000 mg | ORAL_TABLET | Freq: Two times a day (BID) | ORAL | Status: DC | PRN
  Filled 2012-06-07: qty 1

## 2012-06-07 MED ORDER — LEVONORGEST-ETH ESTRAD 91-DAY 0.1-0.02 & 0.01 MG PO TABS: 1.0000 | ORAL_TABLET | Freq: Every day | ORAL | Status: DC

## 2012-06-07 MED ORDER — LEVONORGEST-ETH ESTRAD 91-DAY 0.1-0.02 & 0.01 MG PO TABS
1.0000 | ORAL_TABLET | Freq: Every day | ORAL | Status: DC
Start: 2012-06-07 — End: 2012-06-07

## 2012-06-07 MED ORDER — LIP MEDEX EX OINT: 1.0000 "application " | TOPICAL_OINTMENT | Freq: Two times a day (BID) | CUTANEOUS | Status: DC

## 2012-06-07 MED ORDER — PROMETHAZINE HCL 25 MG/ML IJ SOLN: 6.2500 mg | INTRAMUSCULAR | Status: DC | PRN

## 2012-06-07 MED ORDER — SODIUM CHLORIDE 0.9 % IJ SOLN: 3.0000 mL | INTRAMUSCULAR | Status: DC | PRN

## 2012-06-07 MED ORDER — MAGIC MOUTHWASH
15.0000 mL | Freq: Four times a day (QID) | ORAL | Status: DC | PRN
Start: 2012-06-07 — End: 2012-06-07
  Filled 2012-06-07: qty 15

## 2012-06-07 MED ORDER — SUCCINYLCHOLINE CHLORIDE 20 MG/ML IJ SOLN
INTRAMUSCULAR | Status: DC | PRN
  Administered 2012-06-07: 100 mg via INTRAVENOUS

## 2012-06-07 MED ORDER — NEOSTIGMINE METHYLSULFATE 1 MG/ML IJ SOLN
INTRAMUSCULAR | Status: DC | PRN
  Administered 2012-06-07: 5 mg via INTRAVENOUS

## 2012-06-07 MED ORDER — KETOROLAC TROMETHAMINE 30 MG/ML IJ SOLN
INTRAMUSCULAR | Status: DC | PRN
  Administered 2012-06-07: 30 mg via INTRAVENOUS

## 2012-06-07 MED ORDER — DIPHENHYDRAMINE HCL 50 MG/ML IJ SOLN: 12.5000 mg | Freq: Four times a day (QID) | INTRAMUSCULAR | Status: DC | PRN

## 2012-06-07 MED ORDER — MORPHINE SULFATE 2 MG/ML IJ SOLN
2.0000 mg | Freq: Once | INTRAMUSCULAR | Status: AC
  Administered 2012-06-07: 2 mg via INTRAVENOUS
  Filled 2012-06-07: qty 1

## 2012-06-07 MED ORDER — METRONIDAZOLE IN NACL 5-0.79 MG/ML-% IV SOLN
500.0000 mg | Freq: Four times a day (QID) | INTRAVENOUS | Status: DC
  Administered 2012-06-07 (×2): 500 mg via INTRAVENOUS
  Filled 2012-06-07 (×4): qty 100

## 2012-06-07 MED ORDER — ONDANSETRON HCL 4 MG/2ML IJ SOLN
4.0000 mg | Freq: Once | INTRAMUSCULAR | Status: AC
  Administered 2012-06-07: 4 mg via INTRAVENOUS
  Filled 2012-06-07: qty 2

## 2012-06-07 MED ORDER — LACTATED RINGERS IV SOLN: INTRAVENOUS | Status: DC

## 2012-06-07 MED ORDER — ACETAMINOPHEN 650 MG RE SUPP: 650.0000 mg | Freq: Four times a day (QID) | RECTAL | Status: DC | PRN

## 2012-06-07 MED ORDER — ESCITALOPRAM OXALATE 10 MG PO TABS
10.0000 mg | ORAL_TABLET | Freq: Every day | ORAL | Status: DC
  Filled 2012-06-07: qty 1

## 2012-06-07 MED ORDER — BUPIVACAINE-EPINEPHRINE 0.25% -1:200000 IJ SOLN
INTRAMUSCULAR | Status: DC | PRN
  Administered 2012-06-07: 50 mL

## 2012-06-07 MED ORDER — OXYCODONE HCL 5 MG PO TABS: 5.0000 mg | ORAL_TABLET | ORAL | Status: DC | PRN

## 2012-06-07 MED ORDER — TOPIRAMATE 100 MG PO TABS
100.0000 mg | ORAL_TABLET | Freq: Every day | ORAL | Status: DC
  Filled 2012-06-07: qty 1

## 2012-06-07 MED ORDER — MORPHINE SULFATE 4 MG/ML IJ SOLN
6.0000 mg | Freq: Once | INTRAMUSCULAR | Status: AC
  Administered 2012-06-07: 6 mg via INTRAVENOUS
  Filled 2012-06-07: qty 1
  Filled 2012-06-07: qty 2

## 2012-06-07 MED ORDER — ACETAMINOPHEN 10 MG/ML IV SOLN
INTRAVENOUS | Status: DC | PRN
  Administered 2012-06-07: 1000 mg via INTRAVENOUS

## 2012-06-07 MED ORDER — HYDROMORPHONE HCL PF 1 MG/ML IJ SOLN: 0.5000 mg | INTRAMUSCULAR | Status: DC | PRN

## 2012-06-07 MED ORDER — SODIUM CHLORIDE 0.9 % IV SOLN: 250.0000 mL | INTRAVENOUS | Status: DC | PRN

## 2012-06-07 MED ORDER — ACETAMINOPHEN 325 MG PO TABS
650.0000 mg | ORAL_TABLET | ORAL | Status: DC | PRN
  Administered 2012-06-07: 650 mg via ORAL
  Filled 2012-06-07: qty 2

## 2012-06-07 MED ORDER — ACETAMINOPHEN 10 MG/ML IV SOLN
INTRAVENOUS | Status: AC
  Filled 2012-06-07: qty 100

## 2012-06-07 MED ORDER — LACTATED RINGERS IV SOLN
INTRAVENOUS | Status: DC
  Filled 2012-06-07: qty 1000

## 2012-06-07 MED ORDER — 0.9 % SODIUM CHLORIDE (POUR BTL) OPTIME
TOPICAL | Status: DC | PRN
  Administered 2012-06-07: 1000 mL

## 2012-06-07 MED ORDER — EPHEDRINE SULFATE 50 MG/ML IJ SOLN
INTRAMUSCULAR | Status: DC | PRN
  Administered 2012-06-07: 5 mg via INTRAVENOUS
  Administered 2012-06-07 (×2): 10 mg via INTRAVENOUS

## 2012-06-07 MED ORDER — LIDOCAINE HCL (CARDIAC) 20 MG/ML IV SOLN
INTRAVENOUS | Status: DC | PRN
  Administered 2012-06-07: 100 mg via INTRAVENOUS

## 2012-06-07 MED ORDER — DEXAMETHASONE SODIUM PHOSPHATE 10 MG/ML IJ SOLN
INTRAMUSCULAR | Status: DC | PRN
  Administered 2012-06-07: 10 mg via INTRAVENOUS

## 2012-06-07 MED ORDER — POLYETHYLENE GLYCOL 3350 17 G PO PACK: PACK | ORAL | Status: DC

## 2012-06-07 MED ORDER — GLYCOPYRROLATE 0.2 MG/ML IJ SOLN
INTRAMUSCULAR | Status: DC | PRN
  Administered 2012-06-07: .8 mg via INTRAVENOUS

## 2012-06-07 MED ORDER — LACTATED RINGERS IV SOLN
INTRAVENOUS | Status: DC
  Administered 2012-06-07: 1000 mL via INTRAVENOUS

## 2012-06-07 MED ORDER — ROCURONIUM BROMIDE 100 MG/10ML IV SOLN
INTRAVENOUS | Status: DC | PRN
Start: 2012-06-07 — End: 2012-06-07
  Administered 2012-06-07: 10 mg via INTRAVENOUS
  Administered 2012-06-07: 30 mg via INTRAVENOUS

## 2012-06-07 MED ORDER — ACETAMINOPHEN 325 MG PO TABS: 650.0000 mg | ORAL_TABLET | ORAL | Status: AC | PRN

## 2012-06-07 MED ORDER — NAPROXEN 500 MG PO TABS
500.0000 mg | ORAL_TABLET | Freq: Two times a day (BID) | ORAL | Status: DC
  Filled 2012-06-07 (×2): qty 1

## 2012-06-07 MED ORDER — LACTATED RINGERS IR SOLN
Status: DC | PRN
  Administered 2012-06-07: 1000 mL

## 2012-06-07 MED ORDER — ACETAMINOPHEN 650 MG RE SUPP: 650.0000 mg | RECTAL | Status: DC | PRN

## 2012-06-07 MED ORDER — IBUPROFEN 600 MG PO TABS: 600.0000 mg | ORAL_TABLET | Freq: Four times a day (QID) | ORAL | Status: AC | PRN

## 2012-06-07 MED ORDER — HYDROMORPHONE HCL PF 1 MG/ML IJ SOLN
0.2500 mg | INTRAMUSCULAR | Status: DC | PRN
Start: 2012-06-07 — End: 2012-06-07

## 2012-06-07 MED ORDER — PSYLLIUM 95 % PO PACK
1.0000 | PACK | Freq: Two times a day (BID) | ORAL | Status: DC
  Administered 2012-06-07: 1 via ORAL
  Filled 2012-06-07 (×2): qty 1

## 2012-06-07 MED ORDER — BUPIVACAINE-EPINEPHRINE 0.25% -1:200000 IJ SOLN
INTRAMUSCULAR | Status: AC
  Filled 2012-06-07: qty 1

## 2012-06-07 MED ORDER — LACTATED RINGERS IV SOLN
INTRAVENOUS | Status: DC | PRN
  Administered 2012-06-07: 10:00:00 via INTRAVENOUS

## 2012-06-07 MED ORDER — FENTANYL CITRATE 0.05 MG/ML IJ SOLN
INTRAMUSCULAR | Status: DC | PRN
  Administered 2012-06-07 (×3): 50 ug via INTRAVENOUS

## 2012-06-07 MED ORDER — SACCHAROMYCES BOULARDII 250 MG PO CAPS
250.0000 mg | ORAL_CAPSULE | Freq: Two times a day (BID) | ORAL | Status: DC
  Administered 2012-06-07: 250 mg via ORAL
  Filled 2012-06-07 (×2): qty 1

## 2012-06-07 MED ORDER — SODIUM CHLORIDE 0.9 % IJ SOLN: 3.0000 mL | Freq: Two times a day (BID) | INTRAMUSCULAR | Status: DC

## 2012-06-07 MED ORDER — MIDAZOLAM HCL 5 MG/5ML IJ SOLN
INTRAMUSCULAR | Status: DC | PRN
  Administered 2012-06-07: 2 mg via INTRAVENOUS

## 2012-06-07 SURGICAL SUPPLY — 42 items
APPLIER CLIP 5 13 M/L LIGAMAX5 (MISCELLANEOUS)
APPLIER CLIP ROT 10 11.4 M/L (STAPLE)
APR CLP MED LRG 11.4X10 (STAPLE)
APR CLP MED LRG 5 ANG JAW (MISCELLANEOUS)
BAG SPEC RTRVL LRG 6X4 10 (ENDOMECHANICALS) ×1
CANISTER SUCTION 2500CC (MISCELLANEOUS) ×2 IMPLANT
CLIP APPLIE 5 13 M/L LIGAMAX5 (MISCELLANEOUS) IMPLANT
CLIP APPLIE ROT 10 11.4 M/L (STAPLE) IMPLANT
CLOTH BEACON ORANGE TIMEOUT ST (SAFETY) ×2 IMPLANT
CUTTER FLEX LINEAR 45M (STAPLE) ×1 IMPLANT
DECANTER SPIKE VIAL GLASS SM (MISCELLANEOUS) ×2 IMPLANT
DRAPE LAPAROSCOPIC ABDOMINAL (DRAPES) ×2 IMPLANT
DRSG TEGADERM 2-3/8X2-3/4 SM (GAUZE/BANDAGES/DRESSINGS) ×4 IMPLANT
DRSG TEGADERM 4X4.75 (GAUZE/BANDAGES/DRESSINGS) IMPLANT
ELECT REM PT RETURN 9FT ADLT (ELECTROSURGICAL) ×2
ELECTRODE REM PT RTRN 9FT ADLT (ELECTROSURGICAL) ×1 IMPLANT
ENDOLOOP SUT PDS II  0 18 (SUTURE) ×1
ENDOLOOP SUT PDS II 0 18 (SUTURE) IMPLANT
GLOVE BIOGEL PI IND STRL 7.0 (GLOVE) ×1 IMPLANT
GLOVE BIOGEL PI INDICATOR 7.0 (GLOVE) ×1
GLOVE ECLIPSE 8.0 STRL XLNG CF (GLOVE) ×2 IMPLANT
GLOVE INDICATOR 8.0 STRL GRN (GLOVE) ×4 IMPLANT
GOWN STRL NON-REIN LRG LVL3 (GOWN DISPOSABLE) ×2 IMPLANT
GOWN STRL REIN XL XLG (GOWN DISPOSABLE) ×4 IMPLANT
KIT BASIN OR (CUSTOM PROCEDURE TRAY) ×2 IMPLANT
NS IRRIG 1000ML POUR BTL (IV SOLUTION) ×2 IMPLANT
PENCIL BUTTON HOLSTER BLD 10FT (ELECTRODE) ×2 IMPLANT
POUCH SPECIMEN RETRIEVAL 10MM (ENDOMECHANICALS) ×1 IMPLANT
RELOAD 45 VASCULAR/THIN (ENDOMECHANICALS) IMPLANT
RELOAD STAPLE 45 2.5 WHT GRN (ENDOMECHANICALS) IMPLANT
RELOAD STAPLE 45 3.5 BLU ETS (ENDOMECHANICALS) IMPLANT
RELOAD STAPLE TA45 3.5 REG BLU (ENDOMECHANICALS) ×2 IMPLANT
SET IRRIG TUBING LAPAROSCOPIC (IRRIGATION / IRRIGATOR) ×2 IMPLANT
SOLUTION ANTI FOG 6CC (MISCELLANEOUS) ×2 IMPLANT
SUT MNCRL AB 4-0 PS2 18 (SUTURE) ×2 IMPLANT
TOWEL OR 17X26 10 PK STRL BLUE (TOWEL DISPOSABLE) ×2 IMPLANT
TRAY FOLEY CATH 14FRSI W/METER (CATHETERS) ×2 IMPLANT
TRAY LAP CHOLE (CUSTOM PROCEDURE TRAY) ×2 IMPLANT
TROCAR XCEL BLADELESS 5X75MML (TROCAR) ×1 IMPLANT
TROCAR Z-THREAD FIOS 12X100MM (TROCAR) ×2 IMPLANT
TROCAR Z-THREAD FIOS 5X100MM (TROCAR) ×3 IMPLANT
TUBING INSUFFLATION 10FT LAP (TUBING) ×2 IMPLANT

## 2012-06-07 NOTE — ED Provider Notes (Signed)
Medical screening examination/treatment/procedure(s) were conducted as a shared visit with non-physician practitioner(s) and myself.  I personally evaluated the patient during the encounter   Please see my other note for details  Lyanne Co, MD 06/07/12 [phone]

## 2012-06-07 NOTE — ED Notes (Signed)
Pt alert, nad, c/o generalized abd pain, onset last PM, resp even unlabored, skin pwd, denies changes in bladder, states pain is lower, radiated to back, constipation, tried home remedies, pain more right sided now

## 2012-06-07 NOTE — Anesthesia Postprocedure Evaluation (Signed)
Anesthesia Post Note  Patient: Alexa [name]  Procedure(s) Performed: Procedure(s) (LRB): APPENDECTOMY LAPAROSCOPIC (N/A)  Anesthesia type: General  Patient location: PACU  Post pain: Pain level controlled  Post assessment: Post-op Vital signs reviewed  Last Vitals:  Filed Vitals:   06/07/12 1130  BP: 121/56  Pulse: 76  Temp:   Resp: 17    Post vital signs: Reviewed  Level of consciousness: sedated  Complications: No apparent anesthesia complications

## 2012-06-07 NOTE — ED Provider Notes (Signed)
Medical screening examination/treatment/procedure(s) were conducted as a shared visit with non-physician practitioner(s) and myself.  I personally evaluated the patient during the encounter  The patient with acute appendicitis on CT.  General surgery we contacted.  The patient has been n.p.o. since 1:30 AM.  Ct Abdomen Pelvis W Contrast  06/07/2012  *RADIOLOGY REPORT*  Clinical Data: Lower abdominal and right flank pain; constipation. Mild leukocytosis.  CT ABDOMEN AND PELVIS WITH CONTRAST  Technique:  Multidetector CT imaging of the abdomen and pelvis was performed following the standard protocol during bolus administration of intravenous contrast.  Contrast: OMNIPAQUE IOHEXOL 300 MG/ML  SOLN  Comparison: CT of the abdomen and pelvis performed 07/14/2011  Findings: The visualized lung bases are clear.  A right-sided breast implant is partially imaged.  The liver and spleen are unremarkable in appearance.  The gallbladder is within normal limits.  The pancreas and adrenal glands are unremarkable.  Left-sided parapelvic cysts are noted, measuring up to 3.1 cm in size.  The kidneys are otherwise unremarkable in appearance.  There is no evidence of hydronephrosis.  No renal or ureteral stones are seen.  No free fluid is identified.  The small bowel is unremarkable in appearance.  The stomach is within normal limits.  No acute vascular abnormalities are seen.  The appendix is dilated to 1.2 cm in maximal diameter, with mild associated soft tissue inflammation and slightly increased wall enhancement, compatible with acute appendicitis.  There is no evidence for perforation or abscess formation.  Trace associated free fluid is seen.  Contrast progresses to the level of the cecum.  The colon is grossly unremarkable in appearance.  The bladder is mildly distended and grossly unremarkable in appearance.  The uterus is grossly normal in appearance.  The ovaries are relatively symmetric; no suspicious adnexal masses  are seen.  No inguinal lymphadenopathy is seen.  No acute osseous abnormalities are identified.  IMPRESSION:  1.  Mild acute appendicitis noted, with dilatation of the appendix to 1.2 cm, mild soft tissue inflammation and slightly increased wall enhancement.  No evidence for perforation or abscess formation.  Trace free fluid seen. 2.  Left-sided renal parapelvic cysts noted.  These results were called by telephone on 06/07/2012  at  06:56 a.m. to  Dr. Azalia Bilis, who verbally acknowledged these results.  Original Report Authenticated By: Tonia Ghent, M.D.    Lyanne Co, MD 06/07/12 [phone]

## 2012-06-07 NOTE — ED Notes (Signed)
Pt presents to ED with diffuse abdominal and flank pain that she noted onset around 1600 on yesterday after eating a lot of collard greens, she stated.  She noted that this discomfort is similar to pain she experienced before when she was constipated so she decided to self administer a suppository around 1900 and 2000 and an enema at 0130. She admits to minor relief and unable to sleep.  Denies N/V/D LMP: 1 month ago.   Pt stated that pain is now located and she is tender in her RLQ.

## 2012-06-07 NOTE — Op Note (Signed)
06/07/2012  11:17 AM  PATIENT:  Alexa [name]  46 y.o. female  Patient has no care team.  PRE-OPERATIVE DIAGNOSIS:  appendicitis  POST-OPERATIVE DIAGNOSIS:  Acute appendicitis, nonperforated  PROCEDURE:  Procedure(s): APPENDECTOMY LAPAROSCOPIC  SURGEON:  Surgeon(s): Ardeth Sportsman, MD  ASSISTANT: none   ANESTHESIA:   local and general  EBL:  Total I/O In: 1100 [I.V.:1100] Out: 200 [Urine:200]  Delay start of Pharmacological VTE agent (>24hrs) due to surgical blood loss or risk of bleeding:  no  DRAINS: none   SPECIMEN:  Source of Specimen:  Appendix  DISPOSITION OF SPECIMEN:  PATHOLOGY  COUNTS:  YES  PLAN OF CARE: Discharge to home after PACU  PATIENT DISPOSITION:  PACU - hemodynamically stable.  INDICATION:   Pleasant obese female with abdominal pain and nausea.  Did not improve after enema.  The pain worsened.  Came emergency room.  Physical exam and CT scan are strongly suspicious for appendicitis.  I recommended laparoscopic expiration with probable appendectomy:  The anatomy & physiology of the digestive tract was discussed.  The pathophysiology of appendicitis was discussed.  Natural history risks without surgery was discussed.   I feel the risks of no intervention will lead to serious problems that outweigh the operative risks; therefore, I recommended diagnostic laparoscopy with removal of appendix to remove the pathology.  Laparoscopic & open techniques were discussed.   I noted a good likelihood this will help address the problem.    Risks such as bleeding, infection, abscess, leak, reoperation, possible ostomy, hernia, heart attack, death, and other risks were discussed.  Goals of post-operative recovery were discussed as well.  We will work to minimize complications.  Questions were answered.  The patient expresses understanding & wishes to proceed with surgery.  OR FINDINGS: Retrocecal appendix.  Mild early phlegmon but no definite necrosis or perforation.   No abscess.  No major intra-abdominal lesions.  No hernias.  DESCRIPTION:   The patient was identified & brought into the operating room. Informed consent was confirmed.  The patient underwent general anaesthesia without difficulty.  The patient was positioned appropriately.  VTE prevention in place.  The patient's abdomen was clipped, prepped, & draped in a sterile fashion.  Surgical timeout confirmed our plan.  The patient was positioned in reverse Trendelenburg.  Abdominal entry was gained using optical entry technique in the left lower abdomen.  Entry was clean.  I induced carbon dioxide insufflation.  Camera inspection revealed no injury.  Extra ports were carefully placed under direct laparoscopic visualization.  I mobilized the terminal ileum and cecum and proximal ascending colon in a lateral to medial fashion.  I took care to avoid injuring any retroperitoneal structures.  I freed the appendix off its attachments to the ascending colon and cecal mesentery.  I elevated the appendix. I skeletonized the mesoappendix. I was able to free off the base of the appendix which was still viable.  I stapled the appendix off the cecum using a laparoscopic stapler. I took a healthy cuff viable cecum. I placed the appendix inside an EndoCatch bag and removed out the 12 mm port.  I did copious irrigation. Hemostasis was good in the mesoappendix, colon mesentery, and retroperitoneum. Staple line was intact on the cecum with no bleeding. I washed out the pelvis, retrohepatic space and right paracolic gutter. I washed out the left side as well.  Hemostasis is good. There was no perforation or injury. Because the area cleaned up well after irrigation, I did not place  a drain.  I aspirated the carbon dioxide. I removed the ports. I closed the 12 mm fascia site using a 0 Vicryl stitch. I closed skin using 4-0 monocryl stitch.  Patient was extubated and sent to the recovery room.  I discussed the operative findings  with the patient's husband. I suspect the patient is going used in the hospital only periop. Questions answered. He expressed understanding and appreciation.

## 2012-06-07 NOTE — H&P (Signed)
Alexa [name] is an 46 y.o. female.   Chief Complaint: Abdominal Pain. HPI: Patient's a 46 year old white female who was packing to go on a trip to [place]. Around 4 PM yesterday evening she started having diffuse abdominal discomfort. She thought she might be constipated she had several similar feelings in the past. She took a suppository, had a bowel movement. She had no significant relief of her symptoms. The pain persisted, she denies nausea or vomiting. She woke up around 1 AM and sent her husband out for an enema because of ongoing discomfort. GU see in amount with some bowel movement afterwards, which she describes as loose. She had no relief her abdominal symptoms and subsequently presented to the emergency room and [facility] Labs shows an elevated white count of 12,400. CT scan showed mild acute appendicitis with dilatation of the appendix to 1.2 cm mild soft tissue inflammation and slightly increased wall enhancement. No evidence of perforation or abscess. There was a trace of free fluid seen. There is a left-sided renal parapelvic cyst. We were called this a.m. to evaluate for acute appendicitis.     Past Medical History  Diagnosis Date  . SVT (supraventricular tachycardia) Did poorly on BB, pt thinks it might have been panic attacks.    None since 2009  . Migraines   . Anxiety     Hx of panic attack, resolved on medicines         Melanoma skin resection , some moles removed  Past Surgical History  Procedure Date  . Cesarean section     two  . Breast surgery   . Melanoma resection   . She's had several moles removed.     Family History  Problem Relation Age of Onset  . Coronary artery disease Father    Mother and Brother OK, Brother may have some heart problems, she isn't sure. Social History:  reports that she has never smoked. She does not have any smokeless tobacco history on file. She reports that she does not drink alcohol. Her drug history not on  file.  Allergies: No Known Allergies Prior to Admission medications   Medication Sig Start Date End Date Taking? Authorizing Provider  escitalopram (LEXAPRO) 5 MG tablet Take 10 mg by mouth daily.   Yes Historical Provider, MD  Levonorgestrel-Ethinyl Estradiol (LOSEASONIQUE) 0.1-0.02 & 0.01 MG tablet Take 1 tablet by mouth daily.   Yes Historical Provider, MD  topiramate (TOPAMAX) 100 MG tablet Take 100 mg by mouth daily.    Yes Historical Provider, MD     (Not in a hospital admission)  Results for orders placed during the hospital encounter of 06/07/12 (from the past 48 hour(s))  CBC WITH DIFFERENTIAL     Status: Abnormal   Collection Time   06/07/12  3:31 AM      Component Value Range Comment   WBC 12.4 (*) 4.0 - 10.5 K/uL    RBC 4.73  3.87 - 5.11 MIL/uL    Hemoglobin 14.5  12.0 - 15.0 g/dL    HCT 19.1  47.8 - 29.5 %    MCV 90.5  78.0 - 100.0 fL    MCH 30.7  26.0 - 34.0 pg    MCHC 33.9  30.0 - 36.0 g/dL    RDW 62.1  30.8 - 65.7 %    Platelets 249  150 - 400 K/uL    Neutrophils Relative 82 (*) 43 - 77 %    Neutro Abs 10.1 (*) 1.7 - 7.7  K/uL    Lymphocytes Relative 13  12 - 46 %    Lymphs Abs 1.6  0.7 - 4.0 K/uL    Monocytes Relative 4  3 - 12 %    Monocytes Absolute 0.5  0.1 - 1.0 K/uL    Eosinophils Relative 0  0 - 5 %    Eosinophils Absolute 0.0  0.0 - 0.7 K/uL    Basophils Relative 0  0 - 1 %    Basophils Absolute 0.1  0.0 - 0.1 K/uL   BASIC METABOLIC PANEL     Status: Abnormal   Collection Time   06/07/12  3:31 AM      Component Value Range Comment   Sodium 136  135 - 145 mEq/L    Potassium 3.7  3.5 - 5.1 mEq/L    Chloride 107  96 - 112 mEq/L    CO2 17 (*) 19 - 32 mEq/L    Glucose, Bld 124 (*) 70 - 99 mg/dL    BUN 13  6 - 23 mg/dL    Creatinine, Ser 1.61  0.50 - 1.10 mg/dL    Calcium 8.8  8.4 - 09.6 mg/dL    GFR calc non Af Amer >90  >90 mL/min    GFR calc Af Amer >90  >90 mL/min   WET PREP, GENITAL     Status: Abnormal   Collection Time   06/07/12  5:09 AM       Component Value Range Comment   Yeast Wet Prep HPF POC NONE SEEN  NONE SEEN    Trich, Wet Prep NONE SEEN  NONE SEEN    Clue Cells Wet Prep HPF POC RARE (*) NONE SEEN    WBC, Wet Prep HPF POC FEW (*) NONE SEEN   PREGNANCY, URINE     Status: Normal   Collection Time   06/07/12  5:11 AM      Component Value Range Comment   Preg Test, Ur NEGATIVE  NEGATIVE   URINALYSIS, ROUTINE W REFLEX MICROSCOPIC     Status: Normal   Collection Time   06/07/12  5:11 AM      Component Value Range Comment   Color, Urine YELLOW  YELLOW    APPearance CLEAR  CLEAR    Specific Gravity, Urine 1.013  1.005 - 1.030    pH 7.0  5.0 - 8.0    Glucose, UA NEGATIVE  NEGATIVE mg/dL    Hgb urine dipstick NEGATIVE  NEGATIVE    Bilirubin Urine NEGATIVE  NEGATIVE    Ketones, ur NEGATIVE  NEGATIVE mg/dL    Protein, ur NEGATIVE  NEGATIVE mg/dL    Urobilinogen, UA 0.2  0.0 - 1.0 mg/dL    Nitrite NEGATIVE  NEGATIVE    Leukocytes, UA NEGATIVE  NEGATIVE MICROSCOPIC NOT DONE ON URINES WITH NEGATIVE PROTEIN, BLOOD, LEUKOCYTES, NITRITE, OR GLUCOSE <1000 mg/dL.   Ct Abdomen Pelvis W Contrast  06/07/2012  *RADIOLOGY REPORT*  Clinical Data: Lower abdominal and right flank pain; constipation. Mild leukocytosis.  CT ABDOMEN AND PELVIS WITH CONTRAST  Technique:  Multidetector CT imaging of the abdomen and pelvis was performed following the standard protocol during bolus administration of intravenous contrast.  Contrast: OMNIPAQUE IOHEXOL 300 MG/ML  SOLN  Comparison: CT of the abdomen and pelvis performed 07/14/2011  Findings: The visualized lung bases are clear.  A right-sided breast implant is partially imaged.  The liver and spleen are unremarkable in appearance.  The gallbladder is within normal limits.  The pancreas and adrenal  glands are unremarkable.  Left-sided parapelvic cysts are noted, measuring up to 3.1 cm in size.  The kidneys are otherwise unremarkable in appearance.  There is no evidence of hydronephrosis.  No renal or  ureteral stones are seen.  No free fluid is identified.  The small bowel is unremarkable in appearance.  The stomach is within normal limits.  No acute vascular abnormalities are seen.  The appendix is dilated to 1.2 cm in maximal diameter, with mild associated soft tissue inflammation and slightly increased wall enhancement, compatible with acute appendicitis.  There is no evidence for perforation or abscess formation.  Trace associated free fluid is seen.  Contrast progresses to the level of the cecum.  The colon is grossly unremarkable in appearance.  The bladder is mildly distended and grossly unremarkable in appearance.  The uterus is grossly normal in appearance.  The ovaries are relatively symmetric; no suspicious adnexal masses are seen.  No inguinal lymphadenopathy is seen.  No acute osseous abnormalities are identified.  IMPRESSION:  1.  Mild acute appendicitis noted, with dilatation of the appendix to 1.2 cm, mild soft tissue inflammation and slightly increased wall enhancement.  No evidence for perforation or abscess formation.  Trace free fluid seen. 2.  Left-sided renal parapelvic cysts noted.  These results were called by telephone on 06/07/2012  at  06:56 a.m. to  Dr. Azalia Bilis, who verbally acknowledged these results.  Original Report Authenticated By: Tonia Ghent, M.D.    Review of Systems  Constitutional: Negative for fever, chills, weight loss, malaise/fatigue and diaphoresis.       Felt cold last PM, no chills.  Respiratory: Negative.   Cardiovascular: Negative.   Gastrointestinal: Positive for heartburn (rare), abdominal pain (Started all over abdomen, thought she might be constipated, then it went below umbilicus and mostly RLQ now.) and diarrhea (Some loose stool with suppository and enema.). Negative for nausea, vomiting, constipation, blood in stool and melena.  Genitourinary: Negative.   Musculoskeletal: Negative.        She sees a Chiropractor PRN for adjustments.  Skin:  Negative.   Neurological: Negative.  Negative for weakness.  Endo/Heme/Allergies: Negative.   Psychiatric/Behavioral: The patient is nervous/anxious (Better on meds).     Blood pressure 110/71, pulse 81, temperature 97.6 F (36.4 C), temperature source Oral, resp. rate 18, last menstrual period 05/08/2012, SpO2 99.00%. Physical Exam  Constitutional: She is oriented to person, place, and time. She appears well-developed and well-nourished. No distress.  HENT:  Head: Normocephalic.  Nose: Nose normal.  Eyes: Conjunctivae and EOM are normal. Pupils are equal, round, and reactive to light. Right eye exhibits no discharge. Left eye exhibits no discharge. No scleral icterus.  Neck: Normal range of motion. Neck supple. No JVD present. No tracheal deviation present. No thyromegaly present.  Cardiovascular: Normal rate, regular rhythm, normal heart sounds and intact distal pulses.  Exam reveals no gallop.   No murmur heard. Respiratory: Effort normal and breath sounds normal. No stridor. No respiratory distress. She has no wheezes. She has no rales. She exhibits no tenderness.  GI: Soft. Bowel sounds are normal. She exhibits no distension and no mass. There is tenderness (Most of the tenderness is below umbilicus, more Right side than left.). There is no rebound and no guarding.  Musculoskeletal: Normal range of motion. She exhibits no edema and no tenderness.  Lymphadenopathy:    She has no cervical adenopathy.  Neurological: She is alert and oriented to person, place, and time. She has normal reflexes.  No cranial nerve deficit.  Skin: Skin is warm and dry. No rash noted. She is not diaphoretic. No erythema.  Psychiatric: She has a normal mood and affect. Her behavior is normal. Judgment and thought content normal.     Assessment/Plan 1. Acute appendicitis 2. History of migraines 3. History of anxiety/panic attack controlled with medications. 4. History of melanoma resection left  elbow.   Plan: Dr. Viviann Spare Jubal Rademaker will review the study since the patient shortly we tentatively plan to take her to the operating room later this a.m. for appendectomy. Risks and benefits were discussed with her and she is agreeable. [facility] physician assistant for Dr. Karie Soda.  JENNINGS,WILLARD 06/07/2012, 8:13 AM  I have re-reviewed the the patient's records, history, medications, and allergies.  I have re-examined the patient.  I again discussed intraoperative plans and goals of post-operative recovery.  The patient agrees to proceed.   The anatomy & physiology of the digestive tract was discussed.  The pathophysiology of appendicitis was discussed.  Natural history risks without surgery was discussed.   I feel the risks of no intervention will lead to serious problems that outweigh the operative risks; therefore, I recommended diagnostic laparoscopy with removal of appendix to remove the pathology.  Laparoscopic & open techniques were discussed.   I noted a good likelihood this will help address the problem.    Risks such as bleeding, infection, abscess, leak, reoperation, possible ostomy, hernia, heart attack, death, and other risks were discussed.  Goals of post-operative recovery were discussed as well.  We will work to minimize complications.  Questions were answered.  The patient expresses understanding & wishes to proceed with surgery.

## 2012-06-07 NOTE — Progress Notes (Signed)
Asking to go home, eating lunch now.  Again she would like to go on her trip to [place] tomorrow. Discussed it with Dr. Michaell Cowing and Mr. Brogdon everyone is in agreement.  If she does well with supper she can go later tonight.

## 2012-06-07 NOTE — Transfer of Care (Signed)
Immediate Anesthesia Transfer of Care Note  Patient: Alexa [name]  Procedure(s) Performed: Procedure(s) (LRB): APPENDECTOMY LAPAROSCOPIC (N/A)  Patient Location: PACU  Anesthesia Type: General  Level of Consciousness: sedated, patient cooperative and responds to stimulaton  Airway & Oxygen Therapy: Patient Spontanous Breathing and Patient connected to face mask oxgen  Post-op Assessment: Report given to PACU RN and Post -op Vital signs reviewed and stable  Post vital signs: Reviewed and stable  Complications: No apparent anesthesia complications

## 2012-06-07 NOTE — ED Provider Notes (Signed)
History     CSN: [id number]  Arrival date & time 06/07/12  4098   First MD Initiated Contact with Patient 06/07/12 0248      Chief Complaint  Patient presents with  . Abdominal Pain   HPI  History provided by the patient. Patient is a 46 year old female with history of cesarean section who presents with complaints of lower abdominal pains. Patient reports first having generalized abdominal pain began yesterday afternoon and evening. Patient initially thought pain felt like bloating and constipation symptoms. Patient's last bowel movement was yesterday. Patient began using over-the-counter medications to treat her symptoms with a suppository and enemas. Patient had some loose diarrhea type stools following this treatment but states she is not feel full of activation of stool in symptoms persisted. Pain is not as sharp severe pain and seems to more localized to the right lower quadrant. Pain is worse with some movements. She denies any other aggravating or alleviating factors. Symptoms have not been associated with any chills, sweats, nausea, vomiting decreased appetite. Patient also denies any dysuria, hematuria, urinary frequency or flank pain. There has been no vaginal bleeding or vaginal discharge.    History reviewed. No pertinent past medical history.  Past Surgical History  Procedure Date  . Cesarean section   . Breast surgery     No family history on file.  History  Substance Use Topics  . Smoking status: Never Smoker   . Smokeless tobacco: Not on file  . Alcohol Use: No    OB History    Grav Para Term Preterm Abortions TAB SAB Ect Mult Living                  Review of Systems  Constitutional: Negative for fever, chills and appetite change.  Respiratory: Negative for cough and shortness of breath.   Gastrointestinal: Positive for abdominal pain and constipation. Negative for nausea, vomiting and diarrhea.  Genitourinary: Negative for dysuria, frequency, hematuria,  flank pain, vaginal bleeding and vaginal discharge.  Skin: Negative for rash.    Allergies  Review of patient's allergies indicates no known allergies.  Home Medications   Current Outpatient Rx  Name Route Sig Dispense Refill  . ESCITALOPRAM OXALATE 5 MG PO TABS Oral Take 10 mg by mouth daily.    . TOPIRAMATE 100 MG PO TABS Oral Take 100 mg by mouth 2 (two) times daily.      BP 122/75  Pulse 98  Temp 98.1 F (36.7 C) (Oral)  Resp 16  SpO2 100%  LMP 05/08/2012  Physical Exam  Nursing note and vitals reviewed. Constitutional: She is oriented to person, place, and time. She appears well-developed and well-nourished. No distress.  HENT:  Head: Normocephalic.  Cardiovascular: Normal rate and regular rhythm.   Pulmonary/Chest: Effort normal and breath sounds normal. No respiratory distress. She has no wheezes. She has no rales.  Abdominal: Soft. There is tenderness in the right lower quadrant. There is tenderness at McBurney's point. There is no rebound, no guarding and no CVA tenderness.  Genitourinary: Cervix exhibits no motion tenderness and no friability. Right adnexum displays no mass and no tenderness. Left adnexum displays no mass.       Chaperone was present. Normal exam.  Neurological: She is alert and oriented to person, place, and time.  Skin: Skin is warm and dry. No rash noted.  Psychiatric: She has a normal mood and affect. Her behavior is normal.    ED Course  Procedures   Results for orders  placed during the hospital encounter of 06/07/12  CBC WITH DIFFERENTIAL      Component Value Range   WBC 12.4 (*) 4.0 - 10.5 K/uL   RBC 4.73  3.87 - 5.11 MIL/uL   Hemoglobin 14.5  12.0 - 15.0 g/dL   HCT 91.4  78.2 - 95.6 %   MCV 90.5  78.0 - 100.0 fL   MCH 30.7  26.0 - 34.0 pg   MCHC 33.9  30.0 - 36.0 g/dL   RDW 21.3  08.6 - 57.8 %   Platelets 249  150 - 400 K/uL   Neutrophils Relative 82 (*) 43 - 77 %   Neutro Abs 10.1 (*) 1.7 - 7.7 K/uL   Lymphocytes Relative 13   12 - 46 %   Lymphs Abs 1.6  0.7 - 4.0 K/uL   Monocytes Relative 4  3 - 12 %   Monocytes Absolute 0.5  0.1 - 1.0 K/uL   Eosinophils Relative 0  0 - 5 %   Eosinophils Absolute 0.0  0.0 - 0.7 K/uL   Basophils Relative 0  0 - 1 %   Basophils Absolute 0.1  0.0 - 0.1 K/uL  BASIC METABOLIC PANEL      Component Value Range   Sodium 136  135 - 145 mEq/L   Potassium 3.7  3.5 - 5.1 mEq/L   Chloride 107  96 - 112 mEq/L   CO2 17 (*) 19 - 32 mEq/L   Glucose, Bld 124 (*) 70 - 99 mg/dL   BUN 13  6 - 23 mg/dL   Creatinine, Ser 4.69  0.50 - 1.10 mg/dL   Calcium 8.8  8.4 - 62.9 mg/dL   GFR calc non Af Amer >90  >90 mL/min   GFR calc Af Amer >90  >90 mL/min  PREGNANCY, URINE      Component Value Range   Preg Test, Ur NEGATIVE  NEGATIVE  URINALYSIS, ROUTINE W REFLEX MICROSCOPIC      Component Value Range   Color, Urine YELLOW  YELLOW   APPearance CLEAR  CLEAR   Specific Gravity, Urine 1.013  1.005 - 1.030   pH 7.0  5.0 - 8.0   Glucose, UA NEGATIVE  NEGATIVE mg/dL   Hgb urine dipstick NEGATIVE  NEGATIVE   Bilirubin Urine NEGATIVE  NEGATIVE   Ketones, ur NEGATIVE  NEGATIVE mg/dL   Protein, ur NEGATIVE  NEGATIVE mg/dL   Urobilinogen, UA 0.2  0.0 - 1.0 mg/dL   Nitrite NEGATIVE  NEGATIVE   Leukocytes, UA NEGATIVE  NEGATIVE  WET PREP, GENITAL      Component Value Range   Yeast Wet Prep HPF POC NONE SEEN  NONE SEEN   Trich, Wet Prep NONE SEEN  NONE SEEN   Clue Cells Wet Prep HPF POC RARE (*) NONE SEEN   WBC, Wet Prep HPF POC FEW (*) NONE SEEN      No results found.   No diagnosis found.    MDM  Patient seen and evaluated. Patient in no acute distress. Patient appears comfortable in no severe pains.  Patient was slightly elevated WBC. Pain and right lower quadrant. Pelvic exam normal. Will obtain CT for further evaluation.   Pt discussed in sign out with Leandro Reasoner PA-C.  She will follow results of CT scan.   Angus Seller, [place] 06/07/12 [phone]

## 2012-06-07 NOTE — ED Notes (Signed)
PA at bedside (surgery)

## 2012-06-07 NOTE — Anesthesia Preprocedure Evaluation (Addendum)
Anesthesia Evaluation  Patient identified by MRN, date of birth, ID band Patient awake    Reviewed: Allergy & Precautions, H&P , NPO status , Patient's Chart, lab work & pertinent test results  Airway Mallampati: II TM Distance: >3 FB     Dental  (+) Teeth Intact and Dental Advisory Given   Pulmonary neg pulmonary ROS,  breath sounds clear to auscultation  Pulmonary exam normal       Cardiovascular + dysrhythmias Supra Ventricular Tachycardia Rhythm:Regular Rate:Normal     Neuro/Psych  Headaches, Anxiety    GI/Hepatic negative GI ROS, Neg liver ROS,   Endo/Other  negative endocrine ROS  Renal/GU negative Renal ROS  negative genitourinary   Musculoskeletal negative musculoskeletal ROS (+)   Abdominal   Peds negative pediatric ROS (+)  Hematology negative hematology ROS (+)   Anesthesia Other Findings   Reproductive/Obstetrics negative OB ROS                          Anesthesia Physical Anesthesia Plan  ASA: I and Emergent  Anesthesia Plan: General   Post-op Pain Management:    Induction: Intravenous  Airway Management Planned: Oral ETT  Additional Equipment:   Intra-op Plan:   Post-operative Plan: Extubation in OR  Informed Consent: I have reviewed the patients History and Physical, chart, labs and discussed the procedure including the risks, benefits and alternatives for the proposed anesthesia with the patient or authorized representative who has indicated his/her understanding and acceptance.   Dental advisory given  Plan Discussed with: CRNA  Anesthesia Plan Comments:         Anesthesia Quick Evaluation

## 2012-06-07 NOTE — ED Provider Notes (Signed)
6:12 AM Patient signed out to me by Ivonne Andrew, PA-C.  Patient is awaiting an Abdominal CT scan to rule out Appendicitis.  Patient began having generalized abdominal pain yesterday that has since localized to the RUQ and RLQ.  Reassessed patient.  Patient reports that she is still tender, but her pain is tolerable at this time.  She reports that she does not want additional pain medication.  On exam, patient alert and orientated x 3, Heart RRR, Lungs CTAB, Abdomen soft.  Tenderness to palpation of the RLQ and RUQ.  Rebound tenderness and some guarding present.  Negative Rovsing's.    Patient found to have an Appendicitis on CT.  Dr. Patria Mane has consulted surgery.  Pascal Lux [place], PA-C 06/07/12 [phone]

## 2012-06-08 NOTE — Discharge Summary (Signed)
Physician Discharge Summary  Patient ID: Alexa [name] MRN: [id number] DOB/AGE: Aug 27, 1966 46 y.o.  Admit date: 06/07/2012 Discharge date: 06/08/2012  Admission Diagnoses: Acute appendicitis, nonperforated  2. History of migraines  3. History of anxiety/panic attack controlled with medications.  4. History of melanoma resection left elbow.     Discharge Diagnoses: Same Principal Problem:  *Acute appendicitis   PROCEDURES: APPENDECTOMY LAPAROSCOPIC; 06/07/2012 Dr.Gross      Hospital Course: Patient's a 46 year old white female who was packing to go on a trip to [place]. Around 4 PM yesterday evening she started having diffuse abdominal discomfort. She thought she might be constipated she had several similar feelings in the past. She took a suppository, had a bowel movement. She had no significant relief of her symptoms. The pain persisted, she denies nausea or vomiting. She woke up around 1 AM and sent her husband out for an enema because of ongoing discomfort. GU see in amount with some bowel movement afterwards, which she describes as loose. She had no relief her abdominal symptoms and subsequently presented to the emergency room and [facility]. Labs shows an elevated white count of 12,400. CT scan showed mild acute appendicitis with dilatation of the appendix to 1.2 cm mild soft tissue inflammation and slightly increased wall enhancement. No evidence of perforation or abscess. There was a trace of free fluid seen. There is a left-sided renal parapelvic cyst. We were called this a.m. to evaluate for acute appendicitis.  She was taken to the OR from the ER.  She had an uneventful procedure and transferred to the floor. Diet was advanced, she was mobilized and ask to go home the evening  Of the surgery. She will follow up with DR. Gross after she returns from [place].  She and her husband were instructed prior to discharge. Condition on d/c:  Improved  Disposition: 01-Home or Self  Care   Medication List  As of 06/08/2012 12:01 PM   TAKE these medications         acetaminophen 325 MG tablet   Commonly known as: TYLENOL   Take 2 tablets (650 mg total) by mouth every 4 (four) hours as needed for pain or fever (or Fever >/= 101).      escitalopram 5 MG tablet   Commonly known as: LEXAPRO   Take 10 mg by mouth daily.      HYDROcodone-acetaminophen 5-325 MG per tablet   Commonly known as: NORCO   Take 1-2 tablets by mouth every 4 (four) hours as needed for pain.      ibuprofen 600 MG tablet   Commonly known as: ADVIL,MOTRIN   Take 1 tablet (600 mg total) by mouth every 6 (six) hours as needed for pain.      LOSEASONIQUE 0.1-0.02 & 0.01 MG tablet   Generic drug: Levonorgestrel-Ethinyl Estradiol   Take 1 tablet by mouth daily.      polyethylene glycol packet   Commonly known as: MIRALAX / GLYCOLAX   Use daily as needed for constipation.      topiramate 100 MG tablet   Commonly known as: TOPAMAX   Take 100 mg by mouth daily.           Follow-up Information    Follow up with GROSS,STEVEN C., MD. Schedule an appointment as soon as possible for a visit in 2 weeks. (You can plan to see him after you come back from [place].)    Contact information:   [employer] [address]  [address] [place] [address] [phone]          Signed: Sherrie George 06/08/2012, 12:01 PM

## 2012-06-08 NOTE — Progress Notes (Signed)
Pt discharged to home via wheelchair w/ nurse tech and husband w/ pt.  IV D/c per order.  D/c instructions given w/ teach back.

## 2012-06-09 ENCOUNTER — Encounter (HOSPITAL_COMMUNITY): Payer: Self-pay | Admitting: Surgery

## 2012-06-09 NOTE — Progress Notes (Signed)
Meeting goals = OK to d/c

## 2012-07-14 ENCOUNTER — Telehealth (INDEPENDENT_AMBULATORY_CARE_PROVIDER_SITE_OTHER): Payer: Self-pay

## 2012-07-14 ENCOUNTER — Encounter (INDEPENDENT_AMBULATORY_CARE_PROVIDER_SITE_OTHER): Payer: BC Managed Care – PPO | Admitting: Surgery

## 2012-07-14 NOTE — Telephone Encounter (Signed)
Returned pt's call about whether or not she needed to come in for f/u appt with Dr Michaell Cowing after Lap appy done in July. I advised pt that it is our standard of care to follow the pt back at least one time after surgery for a post op appt with Dr Michaell Cowing. The pt states she has been doing fine after surgery that she traveled the next day out of the country after her lap appy and then [place]. 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 The pt is aware of any problems happen to call our office.

## 2012-10-05 ENCOUNTER — Other Ambulatory Visit: Payer: Self-pay | Admitting: Internal Medicine

## 2012-10-05 DIAGNOSIS — Z1231 Encounter for screening mammogram for malignant neoplasm of breast: Secondary | ICD-10-CM

## 2012-10-21 ENCOUNTER — Encounter: Payer: Self-pay | Admitting: Internal Medicine

## 2012-10-22 ENCOUNTER — Telehealth: Payer: Self-pay | Admitting: Internal Medicine

## 2012-10-22 ENCOUNTER — Encounter: Payer: Self-pay | Admitting: *Deleted

## 2012-10-22 NOTE — Telephone Encounter (Signed)
Scheduled patient with Mike Gip, PA on 10/25/12 at 2:30 PM.

## 2012-10-25 ENCOUNTER — Other Ambulatory Visit (INDEPENDENT_AMBULATORY_CARE_PROVIDER_SITE_OTHER): Payer: BC Managed Care – PPO

## 2012-10-25 ENCOUNTER — Encounter: Payer: Self-pay | Admitting: Physician Assistant

## 2012-10-25 ENCOUNTER — Ambulatory Visit: Payer: BC Managed Care – PPO | Admitting: Gastroenterology

## 2012-10-25 ENCOUNTER — Ambulatory Visit (INDEPENDENT_AMBULATORY_CARE_PROVIDER_SITE_OTHER): Payer: BC Managed Care – PPO | Admitting: Physician Assistant

## 2012-10-25 VITALS — BP 110/70 | HR 88 | Ht 69.0 in | Wt 201.8 lb

## 2012-10-25 DIAGNOSIS — R7989 Other specified abnormal findings of blood chemistry: Secondary | ICD-10-CM

## 2012-10-25 DIAGNOSIS — G43909 Migraine, unspecified, not intractable, without status migrainosus: Secondary | ICD-10-CM | POA: Insufficient documentation

## 2012-10-25 DIAGNOSIS — Z9889 Other specified postprocedural states: Secondary | ICD-10-CM

## 2012-10-25 DIAGNOSIS — Z9049 Acquired absence of other specified parts of digestive tract: Secondary | ICD-10-CM | POA: Insufficient documentation

## 2012-10-25 DIAGNOSIS — D649 Anemia, unspecified: Secondary | ICD-10-CM

## 2012-10-25 LAB — HIGH SENSITIVITY CRP: CRP, High Sensitivity: 20.11 mg/dL — ABNORMAL HIGH (ref 0.000–5.000)

## 2012-10-25 LAB — HEPATIC FUNCTION PANEL
Albumin: 3.7 g/dL (ref 3.5–5.2)
Total Protein: 7.8 g/dL (ref 6.0–8.3)

## 2012-10-25 NOTE — Progress Notes (Signed)
Subjective:    Patient ID: Alexa [name], female    DOB: July 29, 1966, 46 y.o.   MRN: [id number]  HPI  Alexa [name] is a very nice 46 year old female referred today by Dr. Waynard [name]. She is known remotely to Dr. Juanda [name]. She apparently also had a colonoscopy in 2010 which she thinks was done for Hemoccult-positive stool and was unremarkable however she cannot remember the physician's name and that did this procedure . Today she is being referred for evaluation of elevated liver function studies. She has never been told in the past that she has any liver issues but believes that she was told once a couple of years ago that her liver tests were abnormal. She had documented elevated transaminases in July of 2013 at which time she underwent an appendectomy.. Total bilirubin was 0.4 alkaline phosphatase 73 AST of 116 and ALT of 183. Lipid panel was done at that same time and was unremarkable. She had labs done in September of 2013 with total bili 0.3 alkaline phosphatase 63 AST of 122 and ALT of 160. At that same time she had hepatitis serologies done with positive hepatitis A antibody negative hepatitis B and C. ANA was negative and mitochondrial antibody also negative. She had been taking Topamax at 100 mg by mouth daily and has been weaned down to 25 mg daily over the past couple of months. Last set of liver function studies were done October 14 with an AST of 89 ALT of 185. CT scan of the abdomen and pelvis was done in July of 2013 at that time she had the appendicitis and liver was felt to be unremarkable in appearance as was the spleen and gallbladder. She had upper abdominal ultrasound in 2010 that showed mild fatty infiltration of the liver. She has also been told that she has a small hemangioma and had an MRI of the liver done in May of 2013 showing a 1.7 x 1.8 x 1.9 cm caudate lobe nodule stable in appearance and felt most indicative of a hemangioma, liver was otherwise normal. She has no family history of liver disease  that she is aware of, she drinks alcohol occasionally area she has been on birth control pills long-term. She has no GI symptoms and says that she feels fine. She has been having an increase in migraines since decreasing her dose of Topamax and would like to stay on the Topamax if it is not affecting her liver    Review of Systems  Constitutional: Negative.   HENT: Negative.   Eyes: Negative.   Respiratory: Negative.   Cardiovascular: Negative.   Gastrointestinal: Negative.   Genitourinary: Negative.   Musculoskeletal: Negative.   Neurological: Negative.   Hematological: Negative.   Psychiatric/Behavioral: Negative.    Outpatient Prescriptions Prior to Visit  Medication Sig Dispense Refill  . acetaminophen (TYLENOL) 325 MG tablet Take 2 tablets (650 mg total) by mouth every 4 (four) hours as needed for pain or fever (or Fever >/= 101).      Marland Kitchen ALPRAZolam (XANAX) 0.5 MG tablet Take 1/2-1 tablet by mouth three times daily as needed for anxiety      . escitalopram (LEXAPRO) 20 MG tablet Take 20 mg by mouth daily.      Marland Kitchen ibuprofen (ADVIL,MOTRIN) 600 MG tablet Take 600 mg by mouth every 6 (six) hours as needed.      . Levonorgestrel-Ethinyl Estradiol (LOSEASONIQUE) 0.1-0.02 & 0.01 MG tablet Take 1 tablet by mouth daily.      . polyethylene glycol (MIRALAX /  GLYCOLAX) packet Use daily as needed for constipation.  14 each    . SUMAtriptan (IMITREX) 100 MG tablet Take 100 mg by mouth as needed.      Marland Kitchen atenolol (TENORMIN) 25 MG tablet Take 25 mg by mouth daily as needed.      . Cyanocobalamin 1000 MCG CAPS Take 1 capsule by mouth daily.      Marland Kitchen topiramate (TOPAMAX) 100 MG tablet Take 100 mg by mouth daily.        No Known Allergies Patient Active Problem List  Diagnosis  . ANXIETY  . SVT/ PSVT/ PAT  . PALPITATIONS  . CHEST PAIN-UNSPECIFIED  . Acute appendicitis  . S/P appendectomy  . Migraine headache   History  Substance Use Topics  . Smoking status: Never Smoker   . Smokeless  tobacco: Never Used  . Alcohol Use: Yes     Comment: rare       Objective:   Physical Exam  of well-developed white female in no acute distress, pleasant blood pressure 110/70 pulse 88 height 5 foot 9 weight 201. HEENT; nontraumatic normocephalic EOMI PERRLA sclera anicteric, Neck; supple no JVD, Cardiovascular; regular rate and rhythm with S1-S2 no murmur or gallop, Pulmonary; clear bilaterally, Abdomen; soft nontender nondistended no palpable mass or hepatosplenomegaly bowel sounds are active, Rectal; exam not Done , Extremities; no clubbing cyanosis or edema skin warm and dry, Psych; mood and affect normal and appropriate.        Assessment & Plan:  #63  46 year old female with persistent transaminitis of unclear etiology. Consider drug-induced i.e. Topamax, consider Alexa [name], will also rule out for other chronic hepatitis/liver disease.  #2 migraine headache #3 hepatic hemangioma less than 2 cm and stable on most recent MRI May 2013.  Plan; patient will remain on lowest dose of Topamax at 25 mg by mouth daily until remote remainder of workup has been completed. Schedule for upper abdominal ultrasound We'll check iron studies, repeat hepatic panel, CRP,anti-smooth muscle antibody, alpha-1 antitrypsin level, and ceruloplasmin. Plan followup office visit in 2-3 weeks with Dr. Juanda [name] to review above

## 2012-10-25 NOTE — Patient Instructions (Addendum)
Please go to the basement level to have your labs drawn.  We scheduled the Ultrasound for Wed 10-27-2012 . [facility] at 8:30 Am,  Arrive at 8:15 Am.  Have nothing to eat or drink after midnight.  We will call you with the lab and Ultrasound results.

## 2012-10-26 LAB — ALPHA-1-ANTITRYPSIN: A-1 Antitrypsin, Ser: 204 mg/dL — ABNORMAL HIGH (ref 90–200)

## 2012-10-27 ENCOUNTER — Ambulatory Visit (HOSPITAL_COMMUNITY)
Admission: RE | Admit: 2012-10-27 | Discharge: 2012-10-27 | Disposition: A | Discharge status: Home / Self Care | Payer: BC Managed Care – PPO | Source: Ambulatory Visit | Attending: Physician Assistant | Admitting: Physician Assistant

## 2012-10-27 DIAGNOSIS — R7989 Other specified abnormal findings of blood chemistry: Secondary | ICD-10-CM

## 2012-11-10 ENCOUNTER — Encounter: Payer: Self-pay | Admitting: Internal Medicine

## 2012-11-10 ENCOUNTER — Ambulatory Visit: Payer: Self-pay | Admitting: Internal Medicine

## 2012-11-10 ENCOUNTER — Ambulatory Visit (INDEPENDENT_AMBULATORY_CARE_PROVIDER_SITE_OTHER): Payer: BC Managed Care – PPO | Admitting: Internal Medicine

## 2012-11-10 VITALS — BP 110/68 | HR 88 | Ht 69.0 in | Wt 202.5 lb

## 2012-11-10 DIAGNOSIS — Z23 Encounter for immunization: Secondary | ICD-10-CM

## 2012-11-10 DIAGNOSIS — K769 Liver disease, unspecified: Secondary | ICD-10-CM

## 2012-11-10 DIAGNOSIS — R945 Abnormal results of liver function studies: Secondary | ICD-10-CM

## 2012-11-10 NOTE — Progress Notes (Signed)
Alexa [name] 1966/03/02 MRN [id number]  History of Present Illness:  This is a 46 year old white female with mild elevation of transaminases, two to three times the normal limit with normal alkaline phosphatase, albumin, platelet count and an upper abdominal ultrasound showing a normal size spleen and gallbladder. She is asymptomatic.  Extensive liver tests included a normal ceruloplasmin, hepatitis B and C serologies, serum ferritin, alpha-1 antitrypsin, anti-smooth muscle antibody, ANA titer and anti-mitochondrial antibody. She has gained 50 pounds in the last several years. She does not drink excessive alcohol. She decreased her Topamax 25 mg daily with no significant impact on her liver enzymes. She has benign hemangioma of the liver. She came to discuss further disposition.   Past Medical History  Diagnosis Date  . SVT (supraventricular tachycardia)     None since 2009  . Migraines   . Anxiety     Hx of panic attack, resolved on medicines  . Melanoma of left upper arm     Resected, and no further RX. Dr. Campbell Stall  . Elevated LFTs   . IBS (irritable bowel syndrome)   . Liver lesion 2012    benign  . Fatty liver 03/27/09  . Melanoma    Past Surgical History  Procedure Date  . Cesarean section     two  . Melanoma resection   . Mole removal     multiple  . Breast surgery 2006    Breast implants  . Laparoscopic appendectomy 06/07/2012    Procedure: APPENDECTOMY LAPAROSCOPIC;  Surgeon: Ardeth Sportsman, MD;  Location: [facility] ORS;  Service: General;  Laterality: N/A;  . Tubal ligation     reports that she has never smoked. She has never used smokeless tobacco. She reports that she drinks alcohol. Her drug history not on file. family history includes Coronary artery disease in her father and Osteoarthritis in her mother.  There is no history of Colon cancer. No Known Allergies      Review of Systems:  The remainder of the 10 point ROS is negative except as outlined in  H&P    Assessment and Plan:  Problem #1 Mild chronic elevation of transaminases without impairment of synthetic function of the liver. There are no signs of portal hypertension as evidenced by normal splenic size. She has no stigmata of chronic liver disease. The most likely cause of her transaminase elevation is fatty liver. We have discussed at length futher plans for liver evaluation. Personally, I think that she ought to try to lose weight before considering a liver biopsy. I have described a liver biopsy to her and also explained that the liver biopsy is usually done if it would make a change in her treatment or to determine prognosis. I feel that in this setting, we are are dealing with normal liver synthetic function and the liver biopsy would not likely make difference in the treatment plan. I suggested to her to get on a strict dietary modification plan combined with vigorous exercise to lose about 20-30 pounds over the next year or so, but it would be much healthier and preferable. . If after a year she does not lose any weight or if her liver function tests remain elevated despite a significant weight loss of 20-30 pounds, then we will consider a percutaneous liver biopsy. She agrees with the plan. We will be rechecking her liver enzymes every 3 months. I would like to see her when she loses the first 15 pounds.   11/10/2012 [name]

## 2012-11-10 NOTE — Patient Instructions (Addendum)
We have given you your first Hepatitis B injection today. You are scheduled for your second injection on 12/09/12 @ 9 am.  Your physician has requested that you go to the basement for the following lab work 03/01/13: LFT's  Your physician has requested that you go to the basement for the following lab work 05/31/13: LFT's  CC: Dr Rodrigo Ran

## 2012-11-15 ENCOUNTER — Ambulatory Visit
Admission: RE | Admit: 2012-11-15 | Discharge: 2012-11-15 | Disposition: A | Discharge status: Home / Self Care | Payer: BC Managed Care – PPO | Source: Ambulatory Visit | Attending: Internal Medicine | Admitting: Internal Medicine

## 2012-11-15 DIAGNOSIS — Z1231 Encounter for screening mammogram for malignant neoplasm of breast: Secondary | ICD-10-CM

## 2012-12-09 ENCOUNTER — Ambulatory Visit: Payer: BC Managed Care – PPO | Admitting: Internal Medicine

## 2012-12-09 DIAGNOSIS — K769 Liver disease, unspecified: Secondary | ICD-10-CM

## 2013-03-08 ENCOUNTER — Telehealth: Payer: Self-pay | Admitting: *Deleted

## 2013-03-08 NOTE — Telephone Encounter (Signed)
I agree, no sense in repeating the LFT's till she loses 15 lbs.  Repeat LFT's in 6 months?

## 2013-03-08 NOTE — Telephone Encounter (Signed)
Patient returned my call to remind her that she was to have lft's drawn on 03/01/13 (per her 11/10/12 office note). However, patient states that she was under the impression she was only to have LFT's after she lost 15 pounds or so. She was 202 pounds at her 12/13 office visit. She states that she actually had gained weight and was up to 209 pounds but started a gluten free diet, so she is now back down to 202 pounds. She wonders if she still needs to come for labs since there has been no weight loss. Dr Juanda Chance, please advise.

## 2013-03-09 NOTE — Telephone Encounter (Signed)
I have left a voicemail for patient (she asked me to leave a voicemail with Dr Regino Schultze recommendations) that Dr Juanda Chance agrees that she does not need LFT's at this time since there has been no weight loss. Advised that we will try again in October 2014. I will call and remind her when it gets closer to that time.

## 2013-04-05 ENCOUNTER — Telehealth: Payer: Self-pay | Admitting: *Deleted

## 2013-04-05 NOTE — Telephone Encounter (Signed)
Left message for patient to call back  

## 2013-04-05 NOTE — Telephone Encounter (Signed)
-----   Message -----    From: Richardson Chiquito, CMA    Sent: 04/05/2013      To: Richardson Chiquito, CMA  Left message for patient to call back. She is due for her final Hep B. She is scheduled for 05-12-13 @ 9 am.

## 2013-04-08 NOTE — Telephone Encounter (Signed)
Left a message for patient to call me. 

## 2013-04-11 NOTE — Telephone Encounter (Signed)
Left a message for patient to call me. 

## 2013-04-12 NOTE — Telephone Encounter (Signed)
Spoke with patient and reminded her of appointment. Moved appointment to 3:45 PM per patient request.

## 2013-04-19 ENCOUNTER — Telehealth: Payer: Self-pay | Admitting: Internal Medicine

## 2013-04-19 NOTE — Telephone Encounter (Signed)
Left a message for patient to call me. 

## 2013-04-19 NOTE — Telephone Encounter (Signed)
Patient wants a copy of ultrasound report for November. Copy mailed to patient.

## 2013-05-12 ENCOUNTER — Ambulatory Visit (INDEPENDENT_AMBULATORY_CARE_PROVIDER_SITE_OTHER): Payer: BC Managed Care – PPO | Admitting: Internal Medicine

## 2013-05-12 DIAGNOSIS — Z23 Encounter for immunization: Secondary | ICD-10-CM

## 2013-08-09 ENCOUNTER — Encounter: Payer: Self-pay | Admitting: Internal Medicine

## 2013-08-30 ENCOUNTER — Telehealth: Payer: Self-pay | Admitting: *Deleted

## 2013-08-30 DIAGNOSIS — R7989 Other specified abnormal findings of blood chemistry: Secondary | ICD-10-CM

## 2013-08-30 NOTE — Telephone Encounter (Signed)
Message copied by Richardson Chiquito on Tue Aug 30, 2013  2:14 PM ------      Message from: Hart Carwin      Created: Tue Aug 30, 2013  1:45 PM       She really needs an OV since her LFT's are much worse ans just before she comes let's repeat the LFT's again.      ----- Message -----         From: Richardson Chiquito, CMA         Sent: 08/30/2013   9:10 AM           To: Hart Carwin, MD            Dr Juanda Chance, please see 03/08/13 and 04/05/13 telephone notes. Patient was to have repeat LFT's around 09/08/13. However, she had labwork completed by her PCP on 07/20/13 (they were elevated). Results are under "media." Does patient still need repeat labs in October?      ----- Message -----         From: Richardson Chiquito, CMA         Sent: 08/30/2013           To: Richardson Chiquito, CMA            Patient needs to have lft's around October 9th. Orders should still be in computer. See 03/08/13 telephone note. Call and remind patient.             ------

## 2013-08-30 NOTE — Telephone Encounter (Signed)
Left message for patient to call back  

## 2013-09-01 NOTE — Telephone Encounter (Signed)
Left message for patient to call back  

## 2013-09-06 NOTE — Telephone Encounter (Signed)
Left message for patient to call back  

## 2013-09-07 NOTE — Telephone Encounter (Signed)
I have been unsuccessful in getting in touch with patient. I have sent a letter to her home address. We will await a return call.

## 2013-09-16 NOTE — Addendum Note (Signed)
Addended by: Richardson Chiquito on: 09/16/2013 11:39 AM   Modules accepted: Orders

## 2013-09-16 NOTE — Telephone Encounter (Signed)
Patient called back and has been advised of Dr Regino Schultze recommendation to come back for office visit to discuss worsening LFT's. She will come for LFT's prior to her appointment.

## 2013-09-26 ENCOUNTER — Other Ambulatory Visit (INDEPENDENT_AMBULATORY_CARE_PROVIDER_SITE_OTHER): Payer: BC Managed Care – PPO

## 2013-09-26 DIAGNOSIS — R7989 Other specified abnormal findings of blood chemistry: Secondary | ICD-10-CM

## 2013-09-26 LAB — HEPATIC FUNCTION PANEL
Albumin: 3.8 g/dL (ref 3.5–5.2)
Total Protein: 7.2 g/dL (ref 6.0–8.3)

## 2013-09-27 ENCOUNTER — Encounter: Payer: Self-pay | Admitting: Internal Medicine

## 2013-09-27 ENCOUNTER — Ambulatory Visit (INDEPENDENT_AMBULATORY_CARE_PROVIDER_SITE_OTHER): Payer: BC Managed Care – PPO | Admitting: Internal Medicine

## 2013-09-27 VITALS — BP 100/70 | HR 80 | Ht 69.0 in | Wt 204.4 lb

## 2013-09-27 DIAGNOSIS — R7989 Other specified abnormal findings of blood chemistry: Secondary | ICD-10-CM

## 2013-09-27 MED ORDER — PHENTERMINE HCL 37.5 MG PO TABS: 37.5000 mg | ORAL_TABLET | Freq: Every day | ORAL | Status: DC

## 2013-09-27 NOTE — Progress Notes (Signed)
Alexa [name] Feb 07, 1966 MRN [id number]   History of Present Illness:  This is a 47 year old white female with abnormal liver function tests whom we have been following now for at least a year. Her last liver function tests yesterday showed an AST of 108 and ALT of 137. On August 20, her AST was 132, ALT was 190 and albumin was 3.4. One year ago, her AST was 86, ALT was 139. She denies any symptoms of abdominal pain, itching or jaundice. Common causes of liver disease have been ruled out including autoimmune, genetic and infectious causes. She was supposed to lose weight from 202 pounds but actually gained about 15 pounds and she is down now to 204 pounds. She came today to discuss a possible liver biopsy. She would like to wait on the liver biopsy and try to lose more weight.   Past Medical History  Diagnosis Date  . SVT (supraventricular tachycardia)     None since 2009  . Migraines   . Anxiety     Hx of panic attack, resolved on medicines  . Melanoma of left upper arm     Resected, and no further RX. Dr. Campbell Stall  . Elevated LFTs   . IBS (irritable bowel syndrome)   . Liver lesion 2012    benign  . Fatty liver 03/27/09  . Melanoma    Past Surgical History  Procedure Laterality Date  . Cesarean section      two  . Melanoma resection    . Mole removal      multiple  . Breast surgery  2006    Breast implants  . Laparoscopic appendectomy  06/07/2012    Procedure: APPENDECTOMY LAPAROSCOPIC;  Surgeon: Ardeth Sportsman, MD;  Location: [facility];  Service: General;  Laterality: N/A;  . Tubal ligation      reports that she has never smoked. She has never used smokeless tobacco. She reports that she drinks alcohol. She reports that she does not use illicit drugs. family history includes Coronary artery disease in her father; Osteoarthritis in her mother. There is no history of Colon cancer. No Known Allergies      Review of Systems: Trying to lose weight. Negative for abdominal pain  jaundice or pruritus  The remainder of the 10 point ROS is negative except as outlined in H&P   Physical Exam: General appearance  Well developed, in no distress. Psychological normal mood and affect.  Assessment and Plan:  Problem #43 47 year old white female with likely fatty liver or possible steatohepatitis. She shows no evidence of cirrhosis or portal hypertension. Her synthetic liver activity is normal. She has not been able to lose weight. She wants to give it another try. We will help her by giving her phentermine 37.5 mg every morning for the next several months. She will have an appointment with [name] on March 3 and will then decide about liver biopsy  to rule out the possibility of progressive liver disease such as steatohepatitis. I will see her after she sees [name] next spring 2015.   09/27/2013 Alexa [name]

## 2013-09-27 NOTE — Patient Instructions (Signed)
We have sent the following medications to your pharmacy for you to pick up at your convenience: Phentermine  CC: Dr Rodrigo Ran

## 2013-10-10 ENCOUNTER — Other Ambulatory Visit: Payer: Self-pay | Admitting: Dermatology

## 2013-10-26 ENCOUNTER — Other Ambulatory Visit: Payer: Self-pay

## 2013-10-26 DIAGNOSIS — Z1231 Encounter for screening mammogram for malignant neoplasm of breast: Secondary | ICD-10-CM

## 2013-11-11 ENCOUNTER — Encounter: Payer: Self-pay | Admitting: Podiatrist

## 2013-11-11 ENCOUNTER — Ambulatory Visit (INDEPENDENT_AMBULATORY_CARE_PROVIDER_SITE_OTHER): Payer: BC Managed Care – PPO | Admitting: Podiatrist

## 2013-11-11 VITALS — BP 104/74 | HR 85 | Resp 16

## 2013-11-11 DIAGNOSIS — M7750 Other enthesopathy of unspecified foot: Secondary | ICD-10-CM

## 2013-11-11 DIAGNOSIS — M659 Synovitis and tenosynovitis, unspecified: Secondary | ICD-10-CM

## 2013-11-11 MED ORDER — MELOXICAM 7.5 MG PO TABS: ORAL_TABLET | ORAL | Status: DC

## 2013-11-11 NOTE — Progress Notes (Signed)
   Subjective:    Patient ID: Alexa [name], female    DOB: 10-08-1966, 47 y.o.   MRN: [id number]   Alexa [name] presents today for recheck of the left foot. She was seen by me 02/16/2013 after she turned her foot and sprained it the August before. She describes the pain as going down the tendon and she points to the navicular tuberosity as the area of the maximal discomfort. We tried a steroid injection at the last visit and she states she had some improvement but it only lasted about a month. Now she is wearing the ankle brace in tennis shoes and states she's frustrated as it's not improving.  HPI Comments: " i was here a few months ago and i want her to check this foot , it still hurts and last time i think i got a cortisone shot and it may have last a month"   Foot Pain      Review of Systems     Objective:   Physical Exam Neurovascular status intact and unchanged. Continued pain along the navicular and posterior tibial tendon at its insertion and more proximal is noted. Swelling along the posterior tibial tendon is also present. Pinpoint discomfort at the navicular insertion of the posterior tibial tendon noted.    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 Discussed trying another injection versus an MRI versus physical therapy. The patient would like to know if the tendon is torn it would like to schedule an MRI for the first of the year. We will schedule the MRI for her in followup with the results of the study. We will potentially need to schedule surgery and she'll be out of work for 6-8 weeks for full recovery. She is a Runner, broadcasting/film/video and stands daily. Removable plantar fascial brace was dispensed for her use today and she was instructed on the use of anti-inflammatory medication. We'll call her in meloxicam 7.5 and she's had some abnormal liver function studies.  Again she'll be seen  back for result of MRI study.  Marlowe Aschoff DPM

## 2013-11-11 NOTE — Patient Instructions (Signed)
I will schedule your mri through [facility] the first week in January 7th or 8th after 4pm.  They will contact you for the appointment specifics

## 2013-11-15 ENCOUNTER — Telehealth: Payer: Self-pay | Admitting: *Deleted

## 2013-11-15 NOTE — Telephone Encounter (Signed)
Dr Irving Shows ordered MRI of left ankle without contrast.  MRI scheduled 12/07/2013.  Left message [phone], with appt time.

## 2013-11-15 NOTE — Telephone Encounter (Signed)
Message copied by Marissa Nestle on Tue Nov 15, 2013 11:37 AM ------      Message from: Delories Heinz      Created: Fri Nov 11, 2013  5:46 PM      Regarding: mri to schedule       Vikki Ports,      This patient needs an MRI scheduled on her left ankle to evaluate her posterior tibial tendon at its insertion on the navicular for a tear. She wants to do it in January around the seventh or eighth after 4 PM if possible. Can you please scheduled this through [facility] on [address]?            Thanks            [employer] ------

## 2013-11-28 ENCOUNTER — Telehealth: Payer: Self-pay | Admitting: *Deleted

## 2013-11-28 NOTE — Telephone Encounter (Signed)
Prior authorization from [facility]  [id number], for MRI left ankle on 12/07/2013 at 500pm.  This is valid 12/292014 to 12/27/2013.

## 2013-12-07 ENCOUNTER — Ambulatory Visit
Admission: RE | Admit: 2013-12-07 | Discharge: 2013-12-07 | Disposition: A | Discharge status: Home / Self Care | Payer: BC Managed Care – PPO | Source: Ambulatory Visit | Attending: Podiatrist | Admitting: Podiatrist

## 2013-12-08 ENCOUNTER — Other Ambulatory Visit: Payer: Self-pay

## 2013-12-08 ENCOUNTER — Ambulatory Visit
Admission: RE | Admit: 2013-12-08 | Discharge: 2013-12-08 | Disposition: A | Discharge status: Home / Self Care | Payer: BC Managed Care – PPO | Source: Ambulatory Visit

## 2013-12-08 DIAGNOSIS — Z1231 Encounter for screening mammogram for malignant neoplasm of breast: Secondary | ICD-10-CM

## 2013-12-13 ENCOUNTER — Telehealth: Payer: Self-pay | Admitting: *Deleted

## 2013-12-13 NOTE — Telephone Encounter (Signed)
Patient called requesting MRI results.  She stated she had it done on Wednesday of last week.

## 2013-12-14 ENCOUNTER — Telehealth: Payer: Self-pay | Admitting: *Deleted

## 2013-12-14 NOTE — Telephone Encounter (Signed)
Message copied by Lolita Rieger on Wed Dec 14, 2013  1:53 PM ------      Message from: Bronson Ing      Created: Tue Dec 13, 2013  6:15 PM      Regarding: mri       Please inform [place] she has some inflammation in the tendon on the lateral ankle as well as plantar fasciitis.  I would like to see her to discuss the specifics of the report to discuss treatment options      ----- Message -----         From: Rad Results In Interface         Sent: 12/08/2013   8:31 AM           To: Trudie Buckler, DPM                   ------

## 2013-12-14 NOTE — Telephone Encounter (Signed)
Per Dr. Valentina Lucks, I called and informed the patient that she has some inflammation in the tendon on the lateral ankle as well as Plantar Fasciitis.  Advised that Dr. Valentina Lucks would like to see her to go over specifics as well as discuss treatment options.  Patient was transferred to a scheduler to make an appointment.

## 2014-01-04 ENCOUNTER — Ambulatory Visit: Payer: BC Managed Care – PPO | Admitting: Podiatrist

## 2014-01-18 ENCOUNTER — Encounter: Payer: Self-pay | Admitting: Podiatrist

## 2014-01-18 ENCOUNTER — Ambulatory Visit (INDEPENDENT_AMBULATORY_CARE_PROVIDER_SITE_OTHER): Payer: BC Managed Care – PPO | Admitting: Podiatrist

## 2014-01-18 VITALS — BP 115/76 | HR 87 | Resp 18

## 2014-01-18 DIAGNOSIS — M7750 Other enthesopathy of unspecified foot: Secondary | ICD-10-CM

## 2014-01-18 DIAGNOSIS — M659 Synovitis and tenosynovitis, unspecified: Secondary | ICD-10-CM

## 2014-01-18 DIAGNOSIS — Q742 Other congenital malformations of lower limb(s), including pelvic girdle: Secondary | ICD-10-CM

## 2014-01-18 NOTE — Progress Notes (Signed)
The left heel pain comes and goes  Alexa [name] presents today for followup of left foot and heel pain. She points to the navicular tuberosity at the area of maximal tenderness. She had an MRI performed back in January which showed an inflamed accessory navicular. Thickening of the plantar fascia was also identified. See impression below.  IMPRESSION:  1. The dominant finding today type 2 accessory navicular with  osseous edema along the articulation of this accessory navicular  with the remainder of the navicular, favoring symptomatic accessory  navicular.  2. There is also fusiform thickening of the medial band of the  plantar fascia 6 cm distal to the calcaneal attachment, suggesting  localized plantar fibromatosis.  Objective: Neurovascular status is intact. Pain on palpation at the navicular tuberosity at the insertion of the posterior tibial tendon is noted. Minimal pain on palpation along the plantar fascial insertion is also seen left.  Assessment: Inflamed accessory navicular, plantar fasciitis left  Plan: Discussed conservative and surgical treatment options. For surgical options discussed excision of the accessory navicular with tendon anchor. She would have to be in a cast and off her feet for up to 4 weeks. She would then be in a boot for 2-4 more weeks while the tendon healed back to bone. Conservatively I recommended another steroid injection and the patient agreed I prepped the skin with alcohol infiltrated Kenalog and Marcaine mixture to the area proximal to the navicular tuberosity. I was careful to stay away from the tendon insertion itself or the tendon itself. A removable plantar fascial taping was also dispensed in the positive benefit of orthotic therapy was discussed with the patient. She will see how the injection does and if she is interested in as well therapy orthotic therapy she will call. I recommended that she wait one month for orthotics as we're getting a new lab and I think  that they would make a better device for her.

## 2014-01-18 NOTE — Progress Notes (Deleted)
Inject, pt later , orthotics in 1 month

## 2014-01-20 ENCOUNTER — Ambulatory Visit: Payer: BC Managed Care – PPO | Admitting: Podiatrist

## 2014-07-21 ENCOUNTER — Ambulatory Visit (INDEPENDENT_AMBULATORY_CARE_PROVIDER_SITE_OTHER): Payer: BC Managed Care – PPO | Admitting: Podiatrist

## 2014-07-21 ENCOUNTER — Encounter: Payer: Self-pay | Admitting: Podiatrist

## 2014-07-21 VITALS — BP 105/60 | HR 92 | Resp 18

## 2014-07-21 DIAGNOSIS — M722 Plantar fascial fibromatosis: Secondary | ICD-10-CM

## 2014-07-21 MED ORDER — TRIAMCINOLONE ACETONIDE 10 MG/ML IJ SUSP
10.0000 mg | Freq: Once | INTRAMUSCULAR | Status: AC
  Administered 2014-07-21: 10 mg

## 2014-07-27 NOTE — Progress Notes (Signed)
Alexa [name] presents today for followup of plantar fasciitis. She states her heel pain continues to persist and she's considering orthotics.  Objective: Neurovascular status unchanged. Plantar fasciitis symptomatology is present especially the left heel.  Assessment: Plantar fasciitis  Plan: Injected with Kenalog and Marcaine mixture in the area of maximal tenderness left. Also recommended orthotics and she was scanned at today's visit. We'll: Inserts are ready for pick up.

## 2014-08-18 ENCOUNTER — Ambulatory Visit: Payer: BC Managed Care – PPO

## 2014-08-18 DIAGNOSIS — M722 Plantar fascial fibromatosis: Secondary | ICD-10-CM

## 2014-08-18 NOTE — Progress Notes (Signed)
PUO

## 2014-08-18 NOTE — Patient Instructions (Signed)

## 2014-08-20 ENCOUNTER — Emergency Department (HOSPITAL_BASED_OUTPATIENT_CLINIC_OR_DEPARTMENT_OTHER)
Admission: EM | Admit: 2014-08-20 | Discharge: 2014-08-20 | Disposition: A | Discharge status: Home / Self Care | Payer: BC Managed Care – PPO | Attending: Emergency Medicine | Admitting: Emergency Medicine

## 2014-08-20 ENCOUNTER — Encounter (HOSPITAL_BASED_OUTPATIENT_CLINIC_OR_DEPARTMENT_OTHER): Payer: Self-pay | Admitting: Emergency Medicine

## 2014-08-20 DIAGNOSIS — F411 Generalized anxiety disorder: Secondary | ICD-10-CM | POA: Insufficient documentation

## 2014-08-20 DIAGNOSIS — I498 Other specified cardiac arrhythmias: Secondary | ICD-10-CM | POA: Diagnosis not present

## 2014-08-20 DIAGNOSIS — Z85820 Personal history of malignant melanoma of skin: Secondary | ICD-10-CM | POA: Diagnosis not present

## 2014-08-20 DIAGNOSIS — K589 Irritable bowel syndrome without diarrhea: Secondary | ICD-10-CM | POA: Insufficient documentation

## 2014-08-20 DIAGNOSIS — L259 Unspecified contact dermatitis, unspecified cause: Secondary | ICD-10-CM | POA: Diagnosis not present

## 2014-08-20 DIAGNOSIS — Z79899 Other long term (current) drug therapy: Secondary | ICD-10-CM | POA: Diagnosis not present

## 2014-08-20 DIAGNOSIS — Z791 Long term (current) use of non-steroidal anti-inflammatories (NSAID): Secondary | ICD-10-CM | POA: Insufficient documentation

## 2014-08-20 DIAGNOSIS — R21 Rash and other nonspecific skin eruption: Secondary | ICD-10-CM | POA: Diagnosis present

## 2014-08-20 MED ORDER — PREDNISONE 10 MG PO TABS: 20.0000 mg | ORAL_TABLET | Freq: Every day | ORAL | Status: DC

## 2014-08-20 MED ORDER — PREDNISONE 50 MG PO TABS
60.0000 mg | ORAL_TABLET | Freq: Once | ORAL | Status: AC
  Administered 2014-08-20: 60 mg via ORAL
  Filled 2014-08-20 (×2): qty 1

## 2014-08-20 MED ORDER — HYDROXYZINE HCL 25 MG PO TABS: 25.0000 mg | ORAL_TABLET | Freq: Four times a day (QID) | ORAL | Status: DC

## 2014-08-20 NOTE — ED Provider Notes (Signed)
CSN: [id number]     Arrival date & time 08/20/14  1636 History  This chart was scribed for Alexa Johns, MD by Edison Simon, ED Scribe. This patient was seen in room MHOTF/OTF and the patient's care was started at 5:00 PM.    Chief Complaint  Patient presents with  . Rash   The history is provided by the patient. No language interpreter was used.    HPI Comments: Alexa [name] is a 48 y.o. female who presents to the Emergency Department complaining of itching rash to her palms with sudden onset a few hours ago. She reports some associated numbness, tingling, and heat. She also reports some itching to the back of her hands and the soles of her feet. She reports using lotion and calamine without relief of symptoms. She also reports taking Benadryl, showering, and changing clothes. She states symptoms have decreased greatly at this time but have not completely resolved. . She denies having allergic reactions before. She cannot recall touching anything particularly suspicious today. She denies recent medication changes. She denies fever or dizziness.  Past Medical History  Diagnosis Date  . SVT (supraventricular tachycardia)     None since 2009  . Migraines   . Anxiety     Hx of panic attack, resolved on medicines  . Melanoma of left upper arm     Resected, and no further RX. Dr. Crista Luria  . Elevated LFTs   . IBS (irritable bowel syndrome)   . Liver lesion 2012    benign  . Fatty liver 03/27/09  . Melanoma    Past Surgical History  Procedure Laterality Date  . Cesarean section      two  . Melanoma resection    . Mole removal      multiple  . Breast surgery  2006    Breast implants  . Laparoscopic appendectomy  06/07/2012    Procedure: APPENDECTOMY LAPAROSCOPIC;  Surgeon: Adin Hector, MD;  Location: [facility];  Service: General;  Laterality: N/A;  . Tubal ligation    . Appendectomy     Family History  Problem Relation Age of Onset  . Coronary artery disease Father   .  Osteoarthritis Mother   . Colon cancer Neg Hx    History  Substance Use Topics  . Smoking status: Never Smoker   . Smokeless tobacco: Never Used  . Alcohol Use: Yes     Comment: rare   OB History   Grav Para Term Preterm Abortions TAB SAB Ect Mult Living                 Review of Systems  Constitutional: Negative for fever, chills, diaphoresis and fatigue.  HENT: Negative for congestion, rhinorrhea and sneezing.   Eyes: Negative.   Respiratory: Negative for cough, chest tightness and shortness of breath.   Cardiovascular: Negative for chest pain and leg swelling.  Gastrointestinal: Negative for nausea, vomiting, abdominal pain, diarrhea and blood in stool.  Genitourinary: Negative for frequency, hematuria, flank pain and difficulty urinating.  Musculoskeletal: Positive for joint swelling. Negative for arthralgias, back pain and neck pain.  Skin: Negative for rash and wound.       Itching, heat  Neurological: Positive for numbness (and tingling). Negative for dizziness, speech difficulty, weakness and headaches.      Allergies  Review of patient's allergies indicates no known allergies.  Home Medications   Prior to Admission medications   Medication Sig Start Date End Date Taking? Authorizing Provider  ALPRAZolam (XANAX) 0.5 MG tablet Take 1/2-1 tablet by mouth three times daily as needed for anxiety    Historical Provider, MD  CALCIUM-MAGNESIUM-ZINC PO Take 1 capsule by mouth daily.    Historical Provider, MD  Cholecalciferol (VITAMIN D3) 1000 UNITS CAPS Take 1 capsule by mouth daily. 4000 units    Historical Provider, MD  doxycycline (VIBRAMYCIN) 100 MG capsule  11/01/13   Historical Provider, MD  escitalopram (LEXAPRO) 20 MG tablet Take 10 mg by mouth daily.     Historical Provider, MD  hydrOXYzine (ATARAX/VISTARIL) 25 MG tablet Take 1 tablet (25 mg total) by mouth every 6 (six) hours. 08/20/14   Alexa Johns, MD  meloxicam (MOBIC) 7.5 MG tablet Take 1 tab by mouth daily  with food when necessary for foot pain 11/11/13   Bronson Ing, DPM  Multiple Vitamin (MULTIVITAMIN) tablet Take 1 tablet by mouth daily.    Historical Provider, MD  NON FORMULARY livercare-cleanser one daily    Historical Provider, MD  OVER THE COUNTER MEDICATION Nature thyroid (for thyroid) one daily    Historical Provider, MD  phentermine (ADIPEX-P) 37.5 MG tablet Take 1 tablet (37.5 mg total) by mouth daily before breakfast. 09/27/13   Lafayette Dragon, MD  predniSONE (DELTASONE) 10 MG tablet Take 2 tablets (20 mg total) by mouth daily. 08/20/14   Alexa Johns, MD  Riboflavin (VITAMIN B-2 PO) Take 2 tablets by mouth daily as needed.    Historical Provider, MD  SUMAtriptan (IMITREX) 100 MG tablet Take 100 mg by mouth as needed.    Historical Provider, MD   BP 133/87  Pulse 90  Temp(Src) 98.3 F (36.8 C) (Oral)  Resp 19  SpO2 99% Physical Exam  Nursing note and vitals reviewed. Constitutional: She is oriented to person, place, and time. She appears well-developed and well-nourished.  HENT:  Head: Normocephalic and atraumatic.  No angioedema  Eyes: Pupils are equal, round, and reactive to light.  Neck: Normal range of motion. Neck supple.  Cardiovascular: Normal rate, regular rhythm and normal heart sounds.   Pulmonary/Chest: Effort normal and breath sounds normal. No respiratory distress. She has no wheezes. She has no rales. She exhibits no tenderness.  Abdominal: Soft. Bowel sounds are normal. There is no tenderness. There is no rebound and no guarding.  Musculoskeletal: Normal range of motion. She exhibits no edema.  Lymphadenopathy:    She has no cervical adenopathy.  Neurological: She is alert and oriented to person, place, and time.  Skin: Skin is warm and dry. There is erythema (mild to palms of hands, no other rash).  No papules, no petechiae, no purpura, no vesicles  Psychiatric: She has a normal mood and affect.    ED Course  Procedures (including critical care  time) Labs Review Labs Reviewed - No data to display  Imaging Review No results found.   EKG Interpretation None     DIAGNOSTIC STUDIES: Oxygen Saturation is 99% on room air, normal by my interpretation.    COORDINATION OF CARE: 5:14 PM Discussed treatment plan, including steroid per os and Atarax, with patient at bedside, she agrees with the plan and has no further questions at this time.    MDM   Final diagnoses:  Contact dermatitis   Pt with mild erythema, intense itching of palms of hands.  No other rash noted.  Likely contact dermatitis.  Has improved after benadryl.  Will give prednisone, 5 day course, atarax.  F/u with her PMD if symptoms not improved.  I  personally performed the services described in this documentation, which was scribed in my presence.  The recorded information has been reviewed and considered.   Alexa Johns, MD 08/20/14 [phone]

## 2014-08-20 NOTE — Discharge Instructions (Signed)

## 2014-08-20 NOTE — ED Notes (Signed)
Patient c/o rash/itching on hands and feet, sudden onset today, took benadryl no relief

## 2014-10-09 ENCOUNTER — Other Ambulatory Visit: Payer: Self-pay | Admitting: Internal Medicine

## 2014-10-09 DIAGNOSIS — N6322 Unspecified lump in the left breast, upper inner quadrant: Secondary | ICD-10-CM

## 2014-10-20 ENCOUNTER — Ambulatory Visit
Admission: RE | Admit: 2014-10-20 | Discharge: 2014-10-20 | Disposition: A | Discharge status: Home / Self Care | Payer: BC Managed Care – PPO | Source: Ambulatory Visit | Attending: Internal Medicine | Admitting: Internal Medicine

## 2014-10-20 ENCOUNTER — Other Ambulatory Visit: Payer: Self-pay | Admitting: Internal Medicine

## 2014-10-20 DIAGNOSIS — N6322 Unspecified lump in the left breast, upper inner quadrant: Secondary | ICD-10-CM

## 2014-10-23 ENCOUNTER — Other Ambulatory Visit: Payer: Self-pay | Admitting: Internal Medicine

## 2014-10-23 ENCOUNTER — Other Ambulatory Visit: Payer: Self-pay | Admitting: Dermatology

## 2014-10-23 DIAGNOSIS — N6320 Unspecified lump in the left breast, unspecified quadrant: Secondary | ICD-10-CM

## 2014-11-15 ENCOUNTER — Other Ambulatory Visit: Payer: Self-pay | Admitting: Internal Medicine

## 2014-11-15 DIAGNOSIS — N6320 Unspecified lump in the left breast, unspecified quadrant: Secondary | ICD-10-CM

## 2014-11-21 ENCOUNTER — Ambulatory Visit
Admission: RE | Admit: 2014-11-21 | Discharge: 2014-11-21 | Disposition: A | Discharge status: Home / Self Care | Payer: BC Managed Care – PPO | Source: Ambulatory Visit | Attending: Internal Medicine | Admitting: Internal Medicine

## 2014-11-21 DIAGNOSIS — N6320 Unspecified lump in the left breast, unspecified quadrant: Secondary | ICD-10-CM

## 2014-11-21 HISTORY — PX: BREAST BIOPSY: SHX20

## 2014-11-22 ENCOUNTER — Other Ambulatory Visit: Payer: Self-pay

## 2014-11-22 DIAGNOSIS — Z1231 Encounter for screening mammogram for malignant neoplasm of breast: Secondary | ICD-10-CM

## 2014-12-06 ENCOUNTER — Other Ambulatory Visit: Payer: Self-pay | Admitting: Obstetrics and Gynecology

## 2014-12-08 LAB — CYTOLOGY - PAP

## 2014-12-14 ENCOUNTER — Ambulatory Visit
Admission: RE | Admit: 2014-12-14 | Discharge: 2014-12-14 | Disposition: A | Discharge status: Home / Self Care | Payer: BC Managed Care – PPO | Source: Ambulatory Visit

## 2014-12-14 DIAGNOSIS — Z1231 Encounter for screening mammogram for malignant neoplasm of breast: Secondary | ICD-10-CM

## 2014-12-20 ENCOUNTER — Ambulatory Visit (INDEPENDENT_AMBULATORY_CARE_PROVIDER_SITE_OTHER): Payer: BC Managed Care – PPO | Admitting: Podiatrist

## 2014-12-20 ENCOUNTER — Encounter: Payer: Self-pay | Admitting: Podiatrist

## 2014-12-20 VITALS — BP 105/74 | HR 95 | Resp 16

## 2014-12-20 DIAGNOSIS — M6588 Other synovitis and tenosynovitis, other site: Secondary | ICD-10-CM

## 2014-12-20 DIAGNOSIS — M7750 Other enthesopathy of unspecified foot: Secondary | ICD-10-CM

## 2014-12-20 NOTE — Progress Notes (Deleted)
 Subjective:    Patient ID: Alexa [name], female    DOB: 09/09/1949, 49 y.o.   MRN: [id number]  HPI   49 y.o. female with history of displaced left bimalleolar ankle fracture s/p ORIF 12/21.She was admitted on 1/515 for I X D of poorly healing tissue, removal of plate and screws from lateral aspect of ankle with wound vac placement. Her inflammatory markers were elevated at sed rate of 78 nad CRP 12.4. Fluid for OR sent for culture grew MSSA and she was narrowed to Ancef 2g IV q 8 hours, and Rifampin 300mg po q 12 hours.  SHe has had several surgeries with placement of wound vacuum dressing since then most recently on January 20th   I saw her in late January 2015  and she finished yet another  6 weeks of postoperative antibiotics and now had been on keflex with rifampin and then keflex alone.   She has seen Dr. Thompson and is saw Dr. Sanger who has performed skin grafting to her wound.  She did now have a problem with deformities of her toes due to casting is underwent surgery with Dr. Regal with podiatry.   At last visit she was  concerned about appearance of proximal aspect of her wound where she had the graft placed and stated she does not longer have follow-up with her surgeon Dr. Thompson or with Dr. Sanger  I was able to get her in with Dr. Sanger and she was happy with condition with wound and indeed it has improved since I last saw her. Her surgical sites in toes also healing well.   Review of Systems  Constitutional: Negative for fever, chills, diaphoresis, fatigue and unexpected weight change.  HENT: Negative for hearing loss, postnasal drip, rhinorrhea, sinus pressure, sneezing and trouble swallowing.   Eyes: Negative for photophobia and visual disturbance.  Respiratory: Negative for cough, chest tightness, shortness of breath, wheezing and stridor.   Cardiovascular: Negative for chest pain, palpitations and leg swelling.  Gastrointestinal: Negative for vomiting,  abdominal pain, diarrhea and blood in stool.  Genitourinary: Negative for dysuria, hematuria, flank pain and difficulty urinating.  Musculoskeletal: Positive for arthralgias. Negative for myalgias, back pain, joint swelling and gait problem.  Skin: Positive for wound. Negative for color change, pallor and rash.  Neurological: Negative for dizziness, tremors, weakness and light-headedness.  Hematological: Negative for adenopathy. Does not bruise/bleed easily.  Psychiatric/Behavioral: Negative for behavioral problems, confusion, sleep disturbance, dysphoric mood, decreased concentration and agitation.       Objective:   Physical Exam  Constitutional: She is oriented to person, place, and time. She appears well-developed and well-nourished. No distress.  HENT:  Head: Normocephalic and atraumatic.  Mouth/Throat: Oropharynx is clear and moist. No oropharyngeal exudate.  Eyes: Conjunctivae and EOM are normal. No scleral icterus.  Neck: Normal range of motion. Neck supple.  Cardiovascular: Normal rate and regular rhythm.   Pulmonary/Chest: Effort normal. No respiratory distress. She has no wheezes.  Abdominal: She exhibits no distension.  Musculoskeletal: She exhibits no edema or tenderness.  Neurological: She is alert and oriented to person, place, and time. She exhibits normal muscle tone. Coordination normal.  Skin: Skin is warm and dry. No rash noted. She is not diaphoretic. There is erythema. No pallor.  Psychiatric: Her behavior is normal. Judgment and thought content normal.   Wound: in July 2015:     Wound in November 2015 : yellowish discoloration in middle of wound where she also had been applying moisturizer       Wound12/7/15:     Wound today 12/20/14:        Her postoperative site in feet 11/06/14:    Postop site 12/20/14:          Assessment & Plan:  #1  MSSA hardware associated Osteomyelitis Left ankle:  Given improvement in appearance of wound, given  fact that distal hardware was never involved and given her stable ESR, CRP (that have been normal), will take her off keflex and RTC in 2 months  I spent greater than 25  minutes with the patient including greater than 50% of time in face to face counsel of the patient and in coordination of their care.  #2 Toe deformities: sp surgery and is healing well  #3   Need for zoster vaccine: requested vaccine and it was given to her today.     

## 2014-12-22 ENCOUNTER — Ambulatory Visit: Payer: BC Managed Care – PPO | Admitting: Podiatrist

## 2014-12-28 NOTE — Progress Notes (Signed)
Chief Complaint  Patient presents with  . Plantar Fasciitis    Follow up left heel   "Its still hurting me"     HPI: Patient is 49 y.o. female who presents today for followup of left foot pain. She points to the navicular tuberosity at the area of maximal tenderness. She had an MRI performed back in January 2015 which showed an inflamed accessory navicular. Thickening of the plantar fascia was also identified. She relates that the injection given the last visit was very beneficial and that she just recently started having pain from this foot again. She would like to know if she can have another injection at this time.     No Known Allergies  Physical Exam  Patient is awake, alert, and oriented x 3.  In no acute distress.  Vascular status is intact with palpable pedal pulses at 2/4 DP and PT bilateral and capillary refill time within normal limits. Neurological sensation is also intact bilaterally via Semmes Weinstein monofilament at 5/5 sites. Light touch, vibratory sensation, Achilles tendon reflex is intact. Dermatological exam reveals skin color, turger and texture as normal. No open lesions present.  Musculature intact with dorsiflexion, plantarflexion, inversion, eversion. Pain on palpation at the navicular tuberosity at the insertion of the posterior tibial tendon is noted.  Assessment: Inflamed accessory navicular, plantar fasciitis left  Plan: Discussed conservative and surgical treatment options. Discussed physical therapy and she would like to try and hold off until summer if she can.  Another steroid injection was also recommended-- I prepped the skin with alcohol infiltrated Kenalog and Marcaine mixture to the area proximal to the navicular tuberosity. I was careful to stay away from the tendon insertion itself or the tendon itself. She will decrease her activity for the next week and will call if any concerns arise.

## 2015-01-25 ENCOUNTER — Ambulatory Visit (INDEPENDENT_AMBULATORY_CARE_PROVIDER_SITE_OTHER): Payer: BC Managed Care – PPO | Admitting: Neurology

## 2015-01-25 ENCOUNTER — Encounter: Payer: Self-pay | Admitting: Neurology

## 2015-01-25 VITALS — BP 136/85 | HR 88 | Ht 69.0 in | Wt 214.0 lb

## 2015-01-25 DIAGNOSIS — G43109 Migraine with aura, not intractable, without status migrainosus: Secondary | ICD-10-CM | POA: Insufficient documentation

## 2015-01-25 MED ORDER — NORTRIPTYLINE HCL 10 MG PO CAPS: ORAL_CAPSULE | ORAL | Status: DC

## 2015-01-25 MED ORDER — DICLOFENAC POTASSIUM(MIGRAINE) 50 MG PO PACK: 50.0000 mg | PACK | ORAL | Status: DC | PRN

## 2015-01-25 NOTE — Progress Notes (Signed)
PATIENT: Alexa [name] DOB: [birth year]  HISTORICAL  Alexa [name] is a 49 yo RH female, is referred by her gynecologist Dr. Radene Knee, and primary care DR. Perini for evaluation of chronic migraine.  She had previous history of SVT, anxiety, panic attacks, currently taking Lexapro, Ativan as needed, previously tried atenolol 100 mg daily, complains of excessive fatigue, low blood pressure  She had migraine headaches since age 69, typical migraine are occipital, upper nuchal area severe pounding headaches, with associated light noise sensitivity, nauseous, lasting one day, or even longer,  She was under the care of [facility] in the past, tried different preventive medications, Topamax, not helpful, causing cognitive slowing, atenolol, low blood pressure, extreme fatigue,  For abortive treatment, she has taken Maxalt, Zomig, Relpax, Frova, Imitrex workout the past, she is taking Imitrex 100 mg as needed, sometimes requires second, even third dose,  Trigger for her migraines are stress, menstruation, she has average 3 to 8 migraines each months, usually clustered around her menstruation period of time.   REVIEW OF SYSTEMS: Full 14 system review of systems performed and notable only for cough, headaches, allergy, anxiety  ALLERGIES: Allergies  Allergen Reactions  . Zithromax [Azithromycin] Other (See Comments)    Thrush    HOME MEDICATIONS: Current Outpatient Prescriptions  Medication Sig Dispense Refill  . ALPRAZolam (XANAX) 0.5 MG tablet Take 1/2-1 tablet by mouth three times daily as needed for anxiety    . CALCIUM-MAGNESIUM-ZINC PO Take 1 capsule by mouth daily.    . Cholecalciferol (VITAMIN D3) 1000 UNITS CAPS Take 1 capsule by mouth daily. 4000 units    . escitalopram (LEXAPRO) 20 MG tablet Take 10 mg by mouth daily.     . Multiple Vitamin (MULTIVITAMIN) tablet Take 1 tablet by mouth daily.    . NON FORMULARY livercare-cleanser one daily    . OVER THE COUNTER  MEDICATION Petra Kuba thyroid (for thyroid) one daily    . Riboflavin (VITAMIN B-2 PO) Take 2 tablets by mouth daily as needed.    . SUMAtriptan (IMITREX) 100 MG tablet Take 100 mg by mouth as needed.    . valACYclovir (VALTREX) 500 MG tablet Take 500 mg by mouth as needed (Fever Blisters).     No current facility-administered medications for this visit.    PAST MEDICAL HISTORY: Past Medical History  Diagnosis Date  . SVT (supraventricular tachycardia)     None since 2009  . Migraines   . Anxiety     Hx of panic attack, resolved on medicines  . Melanoma of left upper arm     Resected, and no further RX. Dr. Crista Luria  . Elevated LFTs   . IBS (irritable bowel syndrome)   . Liver lesion 2012    benign  . Fatty liver 03/27/09  . Melanoma     PAST SURGICAL HISTORY: Past Surgical History  Procedure Laterality Date  . Cesarean section      two  . Melanoma resection    . Mole removal      multiple  . Breast surgery  2006    Breast implants  . Laparoscopic appendectomy  06/07/2012    Procedure: APPENDECTOMY LAPAROSCOPIC;  Surgeon: Adin Hector, MD;  Location: [facility];  Service: General;  Laterality: N/A;  . Tubal ligation    . Appendectomy      FAMILY HISTORY: Family History  Problem Relation Age of Onset  . Coronary artery disease Father   . Osteoarthritis Mother   .  Colon cancer Neg Hx     SOCIAL HISTORY:  History   Social History  . Marital Status: Married    Spouse Name: Gerald Stabs   . Number of Children: 2  . Years of Education: 16   Occupational History  . Teacher [employer]   Social History Main Topics  . Smoking status: Never Smoker   . Smokeless tobacco: Never Used  . Alcohol Use: Yes     Comment: rare  . Drug Use: No  . Sexual Activity: No   Other Topics Concern  . Not on file   Social History Narrative   Lives at home with husband.   Right-handed.   1 cup caffeine/day.     PHYSICAL EXAM   Filed Vitals:   01/25/15 1619  BP:  136/85  Pulse: 88  Height: 5\' 9"  (1.753 m)  Weight: 214 lb (97.07 kg)    Not recorded      Body mass index is 31.59 kg/(m^2).  PHYSICAL EXAMNIATION:  Gen: NAD, conversant, well nourised, obese, well groomed                     Cardiovascular: Regular rate rhythm, no peripheral edema, warm, nontender. Eyes: Conjunctivae clear without exudates or hemorrhage Neck: Supple, no carotid bruise. Pulmonary: Clear to auscultation bilaterally   NEUROLOGICAL EXAM:  MENTAL STATUS: Speech:    Speech is normal; fluent and spontaneous with normal comprehension.  Cognition:    The patient is oriented to person, place, and time;     recent and remote memory intact;     language fluent;     normal attention, concentration,     fund of knowledge.  CRANIAL NERVES: CN II: Visual fields are full to confrontation. Fundoscopic exam is normal with sharp discs and no vascular changes. Venous pulsations are present bilaterally. Pupils are 4 mm and briskly reactive to light. Visual acuity is 20/20 bilaterally. CN III, IV, VI: extraocular movement are normal. No ptosis. CN V: Facial sensation is intact to pinprick in all 3 divisions bilaterally. Corneal responses are intact.  CN VII: Face is symmetric with normal eye closure and smile. CN VIII: Hearing is normal to rubbing fingers CN IX, X: Palate elevates symmetrically. Phonation is normal. CN XI: Head turning and shoulder shrug are intact CN XII: Tongue is midline with normal movements and no atrophy.  MOTOR: There is no pronator drift of out-stretched arms. Muscle bulk and tone are normal. Muscle strength is normal.   Shoulder abduction Shoulder external rotation Elbow flexion Elbow extension Wrist flexion Wrist extension Finger abduction Hip flexion Knee flexion Knee extension Ankle dorsi flexion Ankle plantar flexion  R 5 5 5 5 5 5 5 5 5 5 5 5   L 5 5 5 5 5 5 5 5 5 5 5 5     REFLEXES: Reflexes are 2+ and symmetric at the biceps, triceps,  knees, and ankles. Plantar responses are flexor.  SENSORY: Light touch, pinprick, position sense, and vibration sense are intact in fingers and toes.  COORDINATION: Rapid alternating movements and fine finger movements are intact. There is no dysmetria on finger-to-nose and heel-knee-shin. There are no abnormal or extraneous movements.   GAIT/STANCE: Posture is normal. Gait is steady with normal steps, base, arm swing, and turning. Heel and toe walking are normal. Tandem gait is normal.  Romberg is absent.   DIAGNOSTIC DATA (LABS, IMAGING, TESTING) - I reviewed patient records, labs, notes, testing and imaging myself where available.  Lab  Results  Component Value Date   WBC 12.4* 06/07/2012   HGB 14.5 06/07/2012   HCT 42.8 06/07/2012   MCV 90.5 06/07/2012   PLT 249 06/07/2012      Component Value Date/Time   NA 136 06/07/2012 0331   K 3.7 06/07/2012 0331   CL 107 06/07/2012 0331   CO2 17* 06/07/2012 0331   GLUCOSE 124* 06/07/2012 0331   BUN 13 06/07/2012 0331   CREATININE 0.60 06/07/2012 0331   CALCIUM 8.8 06/07/2012 0331   PROT 7.2 09/26/2013 1611   ALBUMIN 3.8 09/26/2013 1611   AST 108* 09/26/2013 1611   ALT 137* 09/26/2013 1611   ALKPHOS 68 09/26/2013 1611   BILITOT 0.4 09/26/2013 1611   GFRNONAA >90 06/07/2012 0331   GFRAA >90 06/07/2012 0331   Lab Results  Component Value Date   CHOL  10/05/2008    150        ATP III CLASSIFICATION:  <200     mg/dL   Desirable  200-239  mg/dL   Borderline High  >=240    mg/dL   High   HDL 49 10/05/2008   LDLCALC  10/05/2008    83        Total Cholesterol/HDL:CHD Risk Coronary Heart Disease Risk Table                     Men   Women  1/2 Average Risk   3.4   3.3   TRIG 91 10/05/2008   CHOLHDL 3.1 10/05/2008     ASSESSMENT AND PLAN  Alexa [name] is a 49 y.o. female  with chronic migraine  1, add on preventive medications nortriptyline, 10 mg, titrating to 20 mg every night as preventative medications 2. Keep  Imitrex as needed, cambia be as needed 3. Return to clinic in 1 month  Marcial Pacas, M.D. Ph.D.  [facility] [address] [place], [place] [address] Ph: [phone] Fax: [phone]

## 2015-03-15 ENCOUNTER — Ambulatory Visit: Payer: BC Managed Care – PPO | Admitting: Neurology

## 2015-07-19 ENCOUNTER — Ambulatory Visit: Payer: BC Managed Care – PPO | Admitting: Podiatry

## 2015-07-20 ENCOUNTER — Ambulatory Visit (INDEPENDENT_AMBULATORY_CARE_PROVIDER_SITE_OTHER): Payer: BC Managed Care – PPO | Admitting: Podiatry

## 2015-07-20 ENCOUNTER — Encounter: Payer: Self-pay | Admitting: Podiatry

## 2015-07-20 VITALS — BP 118/77 | HR 81 | Resp 16

## 2015-07-20 DIAGNOSIS — M722 Plantar fascial fibromatosis: Secondary | ICD-10-CM

## 2015-07-20 MED ORDER — TRIAMCINOLONE ACETONIDE 10 MG/ML IJ SUSP
10.0000 mg | Freq: Once | INTRAMUSCULAR | Status: AC
  Administered 2015-07-20: 10 mg

## 2015-07-22 NOTE — Progress Notes (Signed)
Subjective:     Patient ID: Alexa [name], female   DOB: 1966/07/22, 49 y.o.   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

## 2015-11-04 DIAGNOSIS — M79671 Pain in right foot: Secondary | ICD-10-CM | POA: Insufficient documentation

## 2015-12-12 ENCOUNTER — Other Ambulatory Visit: Payer: Self-pay

## 2015-12-12 DIAGNOSIS — Z1231 Encounter for screening mammogram for malignant neoplasm of breast: Secondary | ICD-10-CM

## 2016-01-07 ENCOUNTER — Ambulatory Visit: Payer: BC Managed Care – PPO

## 2016-01-22 ENCOUNTER — Ambulatory Visit
Admission: RE | Admit: 2016-01-22 | Discharge: 2016-01-22 | Disposition: A | Discharge status: Home / Self Care | Payer: BC Managed Care – PPO | Source: Ambulatory Visit

## 2016-01-22 DIAGNOSIS — Z1231 Encounter for screening mammogram for malignant neoplasm of breast: Secondary | ICD-10-CM

## 2016-03-01 ENCOUNTER — Other Ambulatory Visit: Payer: Self-pay | Admitting: Neurology

## 2016-09-19 ENCOUNTER — Ambulatory Visit (INDEPENDENT_AMBULATORY_CARE_PROVIDER_SITE_OTHER): Payer: BC Managed Care – PPO

## 2016-09-19 ENCOUNTER — Ambulatory Visit (INDEPENDENT_AMBULATORY_CARE_PROVIDER_SITE_OTHER): Payer: BC Managed Care – PPO | Admitting: Podiatry

## 2016-09-19 DIAGNOSIS — M79672 Pain in left foot: Secondary | ICD-10-CM

## 2016-09-19 DIAGNOSIS — M722 Plantar fascial fibromatosis: Secondary | ICD-10-CM

## 2016-09-19 NOTE — Patient Instructions (Signed)
Achilles Tendinitis With Rehab Achilles tendinitis is a disorder of the Achilles tendon. The Achilles tendon connects the large calf muscles (Gastrocnemius and Soleus) to the heel bone (calcaneus). This tendon is sometimes called the heel cord. It is important for pushing-off and standing on your toes and is important for walking, running, or jumping. Tendinitis is often caused by overuse and repetitive microtrauma. SYMPTOMS  Pain, tenderness, swelling, warmth, and redness may occur over the Achilles tendon even at rest.  Pain with pushing off, or flexing or extending the ankle.  Pain that is worsened after or during activity. CAUSES   Overuse sometimes seen with rapid increase in exercise programs or in sports requiring running and jumping.  Poor physical conditioning (strength and flexibility or endurance).  Running sports, especially training running down hills.  Inadequate warm-up before practice or play or failure to stretch before participation.  Injury to the tendon. PREVENTION   Warm up and stretch before practice or competition.  Allow time for adequate rest and recovery between practices and competition.  Keep up conditioning.  Keep up ankle and leg flexibility.  Improve or keep muscle strength and endurance.  Improve cardiovascular fitness.  Use proper technique.  Use proper equipment (shoes, skates).  To help prevent recurrence, taping, protective strapping, or an adhesive bandage may be recommended for several weeks after healing is complete. PROGNOSIS   Recovery may take weeks to several months to heal.  Longer recovery is expected if symptoms have been prolonged.  Recovery is usually quicker if the inflammation is due to a direct blow as compared with overuse or sudden strain. RELATED COMPLICATIONS   Healing time will be prolonged if the condition is not correctly treated. The injury must be given plenty of time to heal.  Symptoms can reoccur if  activity is resumed too soon.  Untreated, tendinitis may increase the risk of tendon rupture requiring additional time for recovery and possibly surgery. TREATMENT   The first treatment consists of rest anti-inflammatory medication, and ice to relieve the pain.  Stretching and strengthening exercises after resolution of pain will likely help reduce the risk of recurrence. Referral to a physical therapist or athletic trainer for further evaluation and treatment may be helpful.  A walking boot or cast may be recommended to rest the Achilles tendon. This can help break the cycle of inflammation and microtrauma.  Arch supports (orthotics) may be prescribed or recommended by your caregiver as an adjunct to therapy and rest.  Surgery to remove the inflamed tendon lining or degenerated tendon tissue is rarely necessary and has shown less than predictable results. MEDICATION   Nonsteroidal anti-inflammatory medications, such as aspirin and ibuprofen, may be used for pain and inflammation relief. Do not take within 7 days before surgery. Take these as directed by your caregiver. Contact your caregiver immediately if any bleeding, stomach upset, or signs of allergic reaction occur. Other minor pain relievers, such as acetaminophen, may also be used.  Pain relievers may be prescribed as necessary by your caregiver. Do not take prescription pain medication for longer than 4 to 7 days. Use only as directed and only as much as you need.  Cortisone injections are rarely indicated. Cortisone injections may weaken tendons and predispose to rupture. It is better to give the condition more time to heal than to use them. HEAT AND COLD  Cold is used to relieve pain and reduce inflammation for acute and chronic Achilles tendinitis. Cold should be applied for 10 to 15 minutes every   2 to 3 hours for inflammation and pain and immediately after any activity that aggravates your symptoms. Use ice packs or an ice  massage.  Heat may be used before performing stretching and strengthening activities prescribed by your caregiver. Use a heat pack or a warm soak. SEEK MEDICAL CARE IF:  Symptoms get worse or do not improve in 2 weeks despite treatment.  New, unexplained symptoms develop. Drugs used in treatment may produce side effects. EXERCISES RANGE OF MOTION (ROM) AND STRETCHING EXERCISES - Achilles Tendinitis  These exercises may help you when beginning to rehabilitate your injury. Your symptoms may resolve with or without further involvement from your physician, physical therapist or athletic trainer. While completing these exercises, remember:   Restoring tissue flexibility helps normal motion to return to the joints. This allows healthier, less painful movement and activity.  An effective stretch should be held for at least 30 seconds.  A stretch should never be painful. You should only feel a gentle lengthening or release in the stretched tissue. STRETCH - Gastroc, Standing   Place hands on wall.  Extend right / left leg, keeping the front knee somewhat bent.  Slightly point your toes inward on your back foot.  Keeping your right / left heel on the floor and your knee straight, shift your weight toward the wall, not allowing your back to arch.  You should feel a gentle stretch in the right / left calf. Hold this position for __________ seconds. Repeat __________ times. Complete this stretch __________ times per day. STRETCH - Soleus, Standing   Place hands on wall.  Extend right / left leg, keeping the other knee somewhat bent.  Slightly point your toes inward on your back foot.  Keep your right / left heel on the floor, bend your back knee, and slightly shift your weight over the back leg so that you feel a gentle stretch deep in your back calf.  Hold this position for __________ seconds. Repeat __________ times. Complete this stretch __________ times per day. STRETCH -  Gastrocsoleus, Standing  Note: This exercise can place a lot of stress on your foot and ankle. Please complete this exercise only if specifically instructed by your caregiver.   Place the ball of your right / left foot on a step, keeping your other foot firmly on the same step.  Hold on to the wall or a rail for balance.  Slowly lift your other foot, allowing your body weight to press your heel down over the edge of the step.  You should feel a stretch in your right / left calf.  Hold this position for __________ seconds.  Repeat this exercise with a slight bend in your knee. Repeat __________ times. Complete this stretch __________ times per day.  STRENGTHENING EXERCISES - Achilles Tendinitis These exercises may help you when beginning to rehabilitate your injury. They may resolve your symptoms with or without further involvement from your physician, physical therapist or athletic trainer. While completing these exercises, remember:   Muscles can gain both the endurance and the strength needed for everyday activities through controlled exercises.  Complete these exercises as instructed by your physician, physical therapist or athletic trainer. Progress the resistance and repetitions only as guided.  You may experience muscle soreness or fatigue, but the pain or discomfort you are trying to eliminate should never worsen during these exercises. If this pain does worsen, stop and make certain you are following the directions exactly. If the pain is still present after adjustments,   discontinue the exercise until you can discuss the trouble with your clinician. STRENGTH - Plantar-flexors   Sit with your right / left leg extended. Holding onto both ends of a rubber exercise band/tubing, loop it around the ball of your foot. Keep a slight tension in the band.  Slowly push your toes away from you, pointing them downward.  Hold this position for __________ seconds. Return slowly, controlling the  tension in the band/tubing. Repeat __________ times. Complete this exercise __________ times per day.  STRENGTH - Plantar-flexors   Stand with your feet shoulder width apart. Steady yourself with a wall or table using as little support as needed.  Keeping your weight evenly spread over the width of your feet, rise up on your toes.*  Hold this position for __________ seconds. Repeat __________ times. Complete this exercise __________ times per day.  *If this is too easy, shift your weight toward your right / left leg until you feel challenged. Ultimately, you may be asked to do this exercise with your right / left foot only. STRENGTH - Plantar-flexors, Eccentric  Note: This exercise can place a lot of stress on your foot and ankle. Please complete this exercise only if specifically instructed by your caregiver.   Place the balls of your feet on a step. With your hands, use only enough support from a wall or rail to keep your balance.  Keep your knees straight and rise up on your toes.  Slowly shift your weight entirely to your right / left toes and pick up your opposite foot. Gently and with controlled movement, lower your weight through your right / left foot so that your heel drops below the level of the step. You will feel a slight stretch in the back of your calf at the end position.  Use the healthy leg to help rise up onto the balls of both feet, then lower weight only on the right / left leg again. Build up to 15 repetitions. Then progress to 3 consecutive sets of 15 repetitions.*  After completing the above exercise, complete the same exercise with a slight knee bend (about 30 degrees). Again, build up to 15 repetitions. Then progress to 3 consecutive sets of 15 repetitions.* Perform this exercise __________ times per day.  *When you easily complete 3 sets of 15, your physician, physical therapist or athletic trainer may advise you to add resistance by wearing a backpack filled with  additional weight. STRENGTH - Plantar Flexors, Seated   Sit on a chair that allows your feet to rest flat on the ground. If necessary, sit at the edge of the chair.  Keeping your toes firmly on the ground, lift your right / left heel as far as you can without increasing any discomfort in your ankle. Repeat __________ times. Complete this exercise __________ times a day. *If instructed by your physician, physical therapist or athletic trainer, you may add ____________________ of resistance by placing a weighted object on your right / left knee.   This information is not intended to replace advice given to you by your health care provider. Make sure you discuss any questions you have with your health care provider.   Document Released: 06/18/2005 Document Revised: 12/08/2014 Document Reviewed: 03/01/2009 Elsevier Interactive Patient Education 2016 [employer]   Plantar Fasciitis With Rehab The plantar fascia is a fibrous, ligament-like, soft-tissue structure that spans the bottom of the foot. Plantar fasciitis, also called heel spur syndrome, is a condition that causes pain in the foot due  to inflammation of the tissue. SYMPTOMS   Pain and tenderness on the underneath side of the foot.  Pain that worsens with standing or walking. CAUSES  Plantar fasciitis is caused by irritation and injury to the plantar fascia on the underneath side of the foot. Common mechanisms of injury include:  Direct trauma to bottom of the foot.  Damage to a small nerve that runs under the foot where the main fascia attaches to the heel bone.  Stress placed on the plantar fascia due to bone spurs. RISK INCREASES WITH:   Activities that place stress on the plantar fascia (running, jumping, pivoting, or cutting).  Poor strength and flexibility.  Improperly fitted shoes.  Tight calf muscles.  Flat feet.  Failure to warm-up properly before activity.  Obesity. PREVENTION  Warm up and stretch  properly before activity.  Allow for adequate recovery between workouts.  Maintain physical fitness:  Strength, flexibility, and endurance.  Cardiovascular fitness.  Maintain a health body weight.  Avoid stress on the plantar fascia.  Wear properly fitted shoes, including arch supports for individuals who have flat feet. PROGNOSIS  If treated properly, then the symptoms of plantar fasciitis usually resolve without surgery. However, occasionally surgery is necessary. RELATED COMPLICATIONS   Recurrent symptoms that may result in a chronic condition.  Problems of the lower back that are caused by compensating for the injury, such as limping.  Pain or weakness of the foot during push-off following surgery.  Chronic inflammation, scarring, and partial or complete fascia tear, occurring more often from repeated injections. TREATMENT  Treatment initially involves the use of ice and medication to help reduce pain and inflammation. The use of strengthening and stretching exercises may help reduce pain with activity, especially stretches of the Achilles tendon. These exercises may be performed at home or with a therapist. Your caregiver may recommend that you use heel cups of arch supports to help reduce stress on the plantar fascia. Occasionally, corticosteroid injections are given to reduce inflammation. If symptoms persist for greater than 6 months despite non-surgical (conservative), then surgery may be recommended.  MEDICATION   If pain medication is necessary, then nonsteroidal anti-inflammatory medications, such as aspirin and ibuprofen, or other minor pain relievers, such as acetaminophen, are often recommended.  Do not take pain medication within 7 days before surgery.  Prescription pain relievers may be given if deemed necessary by your caregiver. Use only as directed and only as much as you need.  Corticosteroid injections may be given by your caregiver. These injections should  be reserved for the most serious cases, because they may only be given a certain number of times. HEAT AND COLD  Cold treatment (icing) relieves pain and reduces inflammation. Cold treatment should be applied for 10 to 15 minutes every 2 to 3 hours for inflammation and pain and immediately after any activity that aggravates your symptoms. Use ice packs or massage the area with a piece of ice (ice massage).  Heat treatment may be used prior to performing the stretching and strengthening activities prescribed by your caregiver, physical therapist, or athletic trainer. Use a heat pack or soak the injury in warm water. SEEK IMMEDIATE MEDICAL CARE IF:  Treatment seems to offer no benefit, or the condition worsens.  Any medications produce adverse side effects. EXERCISES RANGE OF MOTION (ROM) AND STRETCHING EXERCISES - Plantar Fasciitis (Heel Spur Syndrome) These exercises may help you when beginning to rehabilitate your injury. Your symptoms may resolve with or without further involvement from your  physician, physical therapist or athletic trainer. While completing these exercises, remember:   Restoring tissue flexibility helps normal motion to return to the joints. This allows healthier, less painful movement and activity.  An effective stretch should be held for at least 30 seconds.  A stretch should never be painful. You should only feel a gentle lengthening or release in the stretched tissue. RANGE OF MOTION - Toe Extension, Flexion  Sit with your right / left leg crossed over your opposite knee.  Grasp your toes and gently pull them back toward the top of your foot. You should feel a stretch on the bottom of your toes and/or foot.  Hold this stretch for __________ seconds.  Now, gently pull your toes toward the bottom of your foot. You should feel a stretch on the top of your toes and or foot.  Hold this stretch for __________ seconds. Repeat __________ times. Complete this stretch  __________ times per day.  RANGE OF MOTION - Ankle Dorsiflexion, Active Assisted  Remove shoes and sit on a chair that is preferably not on a carpeted surface.  Place right / left foot under knee. Extend your opposite leg for support.  Keeping your heel down, slide your right / left foot back toward the chair until you feel a stretch at your ankle or calf. If you do not feel a stretch, slide your bottom forward to the edge of the chair, while still keeping your heel down.  Hold this stretch for __________ seconds. Repeat __________ times. Complete this stretch __________ times per day.  STRETCH - Gastroc, Standing  Place hands on wall.  Extend right / left leg, keeping the front knee somewhat bent.  Slightly point your toes inward on your back foot.  Keeping your right / left heel on the floor and your knee straight, shift your weight toward the wall, not allowing your back to arch.  You should feel a gentle stretch in the right / left calf. Hold this position for __________ seconds. Repeat __________ times. Complete this stretch __________ times per day. STRETCH - Soleus, Standing  Place hands on wall.  Extend right / left leg, keeping the other knee somewhat bent.  Slightly point your toes inward on your back foot.  Keep your right / left heel on the floor, bend your back knee, and slightly shift your weight over the back leg so that you feel a gentle stretch deep in your back calf.  Hold this position for __________ seconds. Repeat __________ times. Complete this stretch __________ times per day. STRETCH - Gastrocsoleus, Standing  Note: This exercise can place a lot of stress on your foot and ankle. Please complete this exercise only if specifically instructed by your caregiver.   Place the ball of your right / left foot on a step, keeping your other foot firmly on the same step.  Hold on to the wall or a rail for balance.  Slowly lift your other foot, allowing your body  weight to press your heel down over the edge of the step.  You should feel a stretch in your right / left calf.  Hold this position for __________ seconds.  Repeat this exercise with a slight bend in your right / left knee. Repeat __________ times. Complete this stretch __________ times per day.  STRENGTHENING EXERCISES - Plantar Fasciitis (Heel Spur Syndrome)  These exercises may help you when beginning to rehabilitate your injury. They may resolve your symptoms with or without further involvement from your physician,  physical therapist or athletic trainer. While completing these exercises, remember:   Muscles can gain both the endurance and the strength needed for everyday activities through controlled exercises.  Complete these exercises as instructed by your physician, physical therapist or athletic trainer. Progress the resistance and repetitions only as guided. STRENGTH - Towel Curls  Sit in a chair positioned on a non-carpeted surface.  Place your foot on a towel, keeping your heel on the floor.  Pull the towel toward your heel by only curling your toes. Keep your heel on the floor.  If instructed by your physician, physical therapist or athletic trainer, add ____________________ at the end of the towel. Repeat __________ times. Complete this exercise __________ times per day. STRENGTH - Ankle Inversion  Secure one end of a rubber exercise band/tubing to a fixed object (table, pole). Loop the other end around your foot just before your toes.  Place your fists between your knees. This will focus your strengthening at your ankle.  Slowly, pull your big toe up and in, making sure the band/tubing is positioned to resist the entire motion.  Hold this position for __________ seconds.  Have your muscles resist the band/tubing as it slowly pulls your foot back to the starting position. Repeat __________ times. Complete this exercises __________ times per day.    This information  is not intended to replace advice given to you by your health care provider. Make sure you discuss any questions you have with your health care provider.   Document Released: 11/17/2005 Document Revised: 04/03/2015 Document Reviewed: 03/01/2009 Elsevier Interactive Patient Education [employer].

## 2016-09-25 NOTE — Progress Notes (Signed)
Subjective: Patient presents with the office today for concerns of recurrence of left heel pain which started about 4 weeks ago. She previously was treated for plantar fasciitis last year and since she has had recurrence. This started about 4 weeks ago. No recent injury or trauma. No swelling or redness. No numbness or tingling. Does not wake her up at night. No recent treatment  Denies any systemic complaints such as fevers, chills, nausea, vomiting. No acute changes since last appointment, and no other complaints at this time.   Objective: AAO x3, NAD DP/PT pulses palpable bilaterally, CRT less than 3 seconds Tenderness to palpation along the plantar medial tubercle of the calcaneus at the insertion of plantar fascia on the left foot. There is no pain along the course of the plantar fascia within the arch of the foot. Plantar fascia appears to be intact. There is no pain with lateral compression of the calcaneus or pain with vibratory sensation. There is no pain along the course or insertion of the achilles tendon. No other areas of tenderness to bilateral lower extremities. MMT 5/5, ROM intact. Equinus is present No open lesions or pre-ulcerative lesions.  No pain with calf compression, swelling, warmth, erythema  Assessment: Reoccurrence of plantar fasciitis left foot  Plan: -All treatment options discussed with the patient including all alternatives, risks, complications.  -X-rays were obtained and reviewed with the patient. No evidence of acute fracture -Etiology of symptoms were discussed -Patient elects to proceed with steroid injection into the left heel. Under sterile skin preparation, a total of 2.5cc of kenalog 10, 0.5% Marcaine plain, and 2% lidocaine plain were infiltrated into the symptomatic area without complication. A band-aid was applied. Patient tolerated the injection well without complication. Post-injection care with discussed with the patient. Discussed with the patient to  ice the area over the next couple of days to help prevent a steroid flare.  -Shegear changes -Discussed orthotics  -Plantar fascial brace -Stretching and icing -Patient encouraged to call the office with any questions, concerns, change in symptoms.   Celesta Gentile, DPM

## 2016-10-20 ENCOUNTER — Ambulatory Visit: Payer: BC Managed Care – PPO | Admitting: Podiatry

## 2016-12-26 ENCOUNTER — Other Ambulatory Visit: Payer: Self-pay | Admitting: Internal Medicine

## 2016-12-26 DIAGNOSIS — Z1231 Encounter for screening mammogram for malignant neoplasm of breast: Secondary | ICD-10-CM

## 2017-01-26 ENCOUNTER — Ambulatory Visit
Admission: RE | Admit: 2017-01-26 | Discharge: 2017-01-26 | Disposition: A | Discharge status: Home / Self Care | Payer: BC Managed Care – PPO | Source: Ambulatory Visit | Attending: Internal Medicine | Admitting: Internal Medicine

## 2017-01-26 ENCOUNTER — Other Ambulatory Visit: Payer: Self-pay | Admitting: Internal Medicine

## 2017-01-26 DIAGNOSIS — Z1231 Encounter for screening mammogram for malignant neoplasm of breast: Secondary | ICD-10-CM

## 2017-02-25 ENCOUNTER — Other Ambulatory Visit: Payer: Self-pay | Admitting: Obstetrics and Gynecology

## 2017-02-25 DIAGNOSIS — N63 Unspecified lump in unspecified breast: Secondary | ICD-10-CM

## 2017-03-02 ENCOUNTER — Ambulatory Visit
Admission: RE | Admit: 2017-03-02 | Discharge: 2017-03-02 | Disposition: A | Discharge status: Home / Self Care | Payer: BC Managed Care – PPO | Source: Ambulatory Visit | Attending: Obstetrics and Gynecology | Admitting: Obstetrics and Gynecology

## 2017-03-02 ENCOUNTER — Other Ambulatory Visit: Payer: Self-pay | Admitting: Obstetrics and Gynecology

## 2017-03-02 DIAGNOSIS — N63 Unspecified lump in unspecified breast: Secondary | ICD-10-CM

## 2017-04-24 ENCOUNTER — Ambulatory Visit: Payer: BC Managed Care – PPO | Admitting: Podiatry

## 2017-09-14 ENCOUNTER — Other Ambulatory Visit: Payer: Self-pay | Admitting: Internal Medicine

## 2017-09-14 DIAGNOSIS — R7989 Other specified abnormal findings of blood chemistry: Secondary | ICD-10-CM

## 2017-09-14 DIAGNOSIS — R945 Abnormal results of liver function studies: Secondary | ICD-10-CM

## 2017-09-17 ENCOUNTER — Other Ambulatory Visit: Payer: BC Managed Care – PPO

## 2017-10-02 ENCOUNTER — Ambulatory Visit
Admission: RE | Admit: 2017-10-02 | Discharge: 2017-10-02 | Disposition: A | Discharge status: Home / Self Care | Payer: BC Managed Care – PPO | Source: Ambulatory Visit | Attending: Internal Medicine | Admitting: Internal Medicine

## 2017-10-02 DIAGNOSIS — R7989 Other specified abnormal findings of blood chemistry: Secondary | ICD-10-CM

## 2017-10-02 DIAGNOSIS — R945 Abnormal results of liver function studies: Secondary | ICD-10-CM

## 2017-12-01 IMAGING — US US ABDOMEN LIMITED
1 series · 13 of 25 positions shown · non-contrast
Comparison: Abdominal ultrasound October 27, 2012

CLINICAL DATA: Abnormal liver enzymes

EXAM:
ULTRASOUND ABDOMEN LIMITED RIGHT UPPER QUADRANT

[Series 1: us abdomen limited · 0.26mm/px · 13 of 59 slices shown]
[im 1/59]
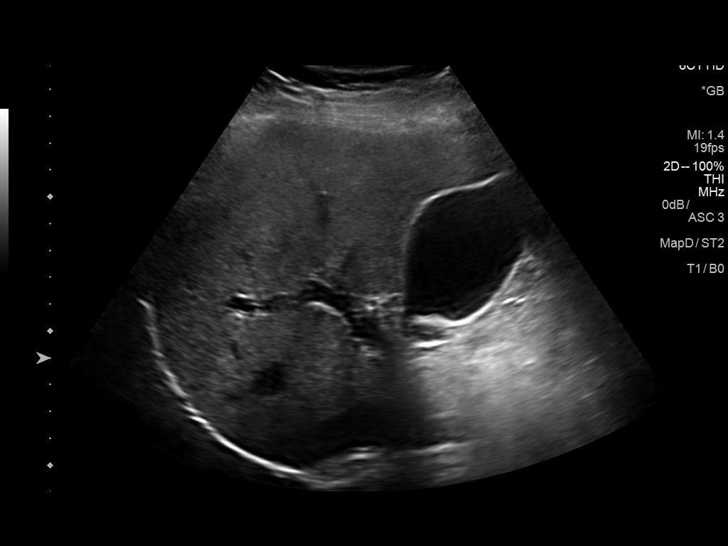
[im 5/59]
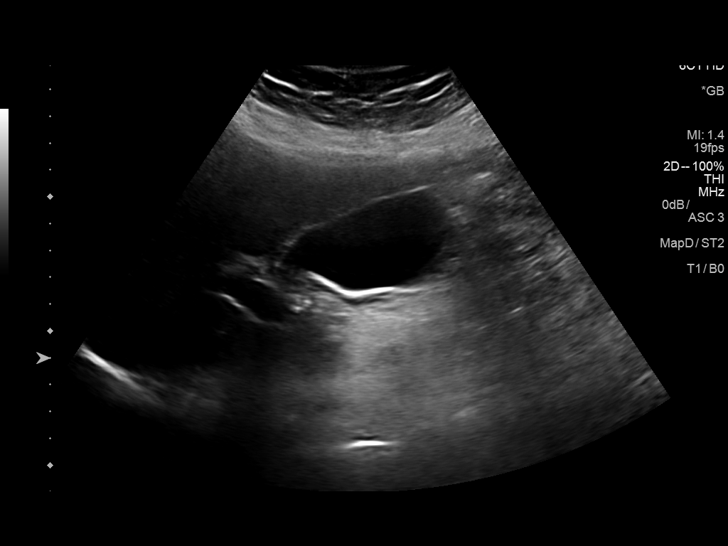
[im 10/59]
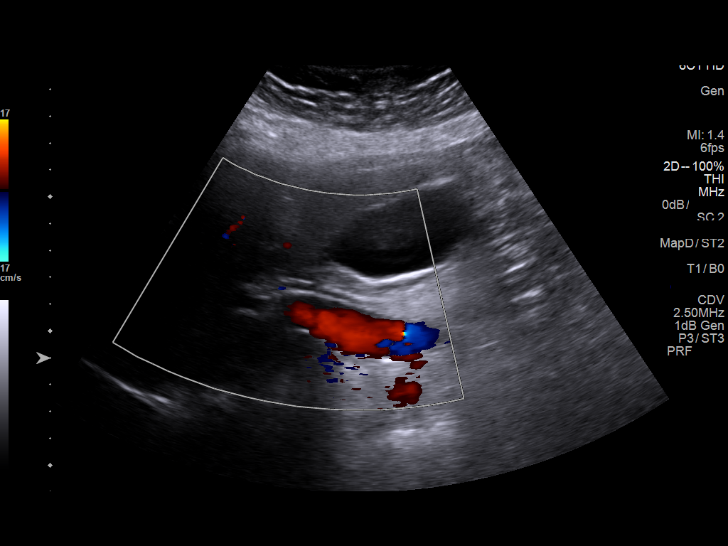
[im 15/59]
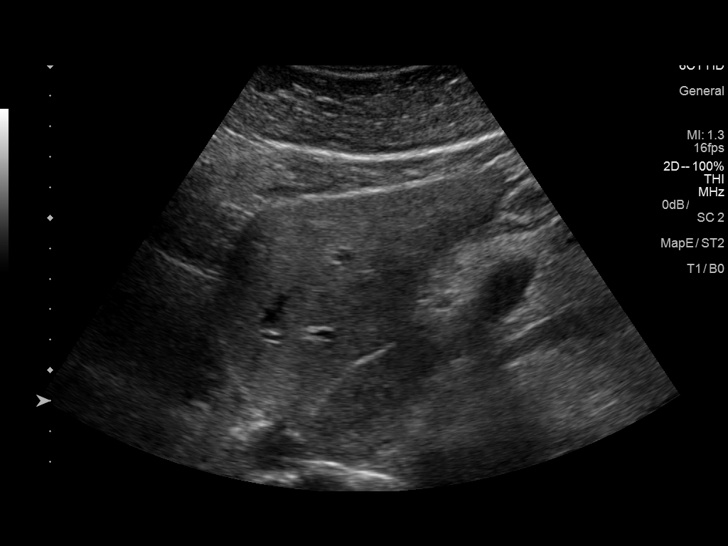
[im 20/59]
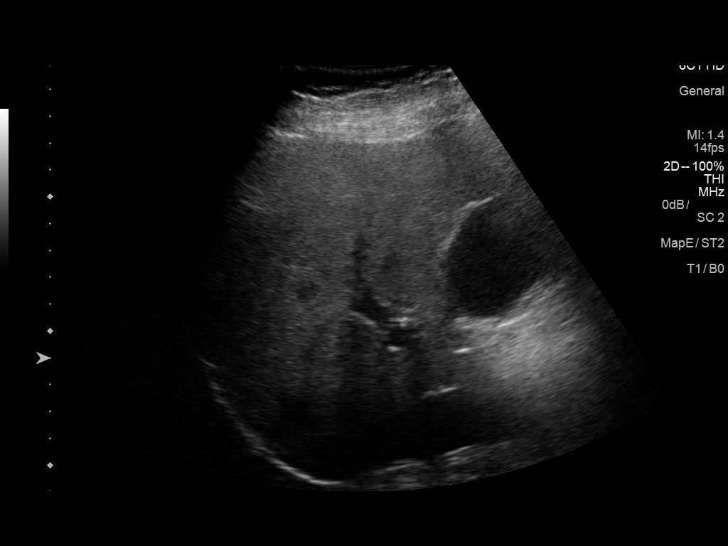
[im 25/59]
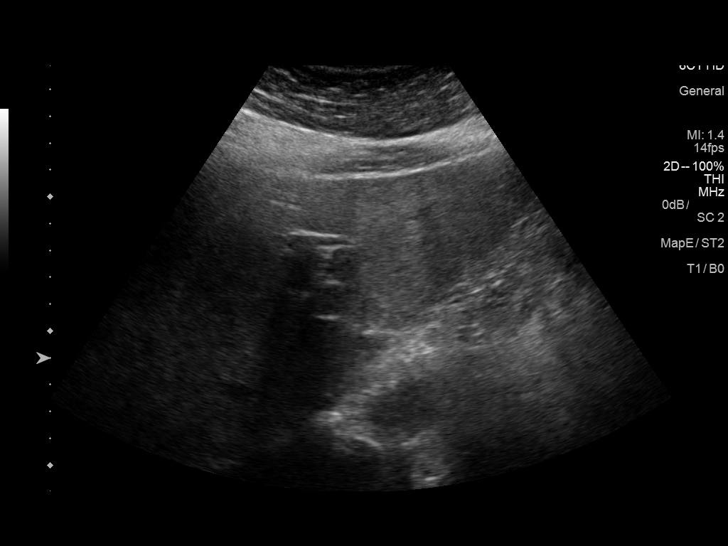
[im 30/59]
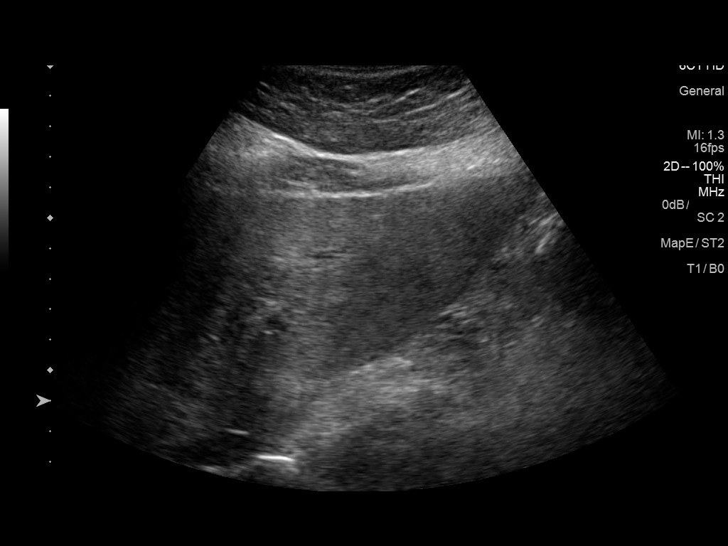
[im 34/59]
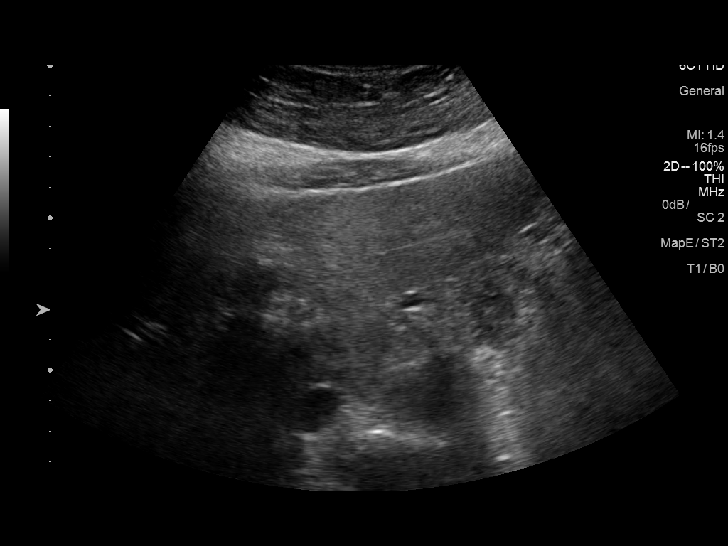
[im 39/59]
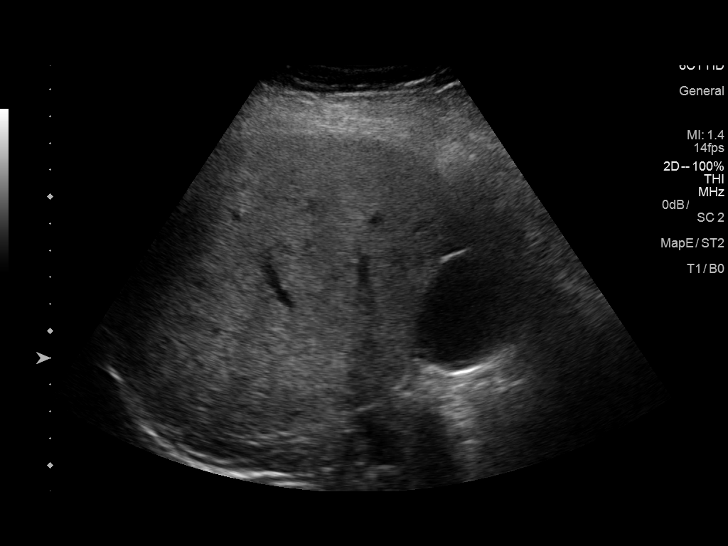
[im 44/59]
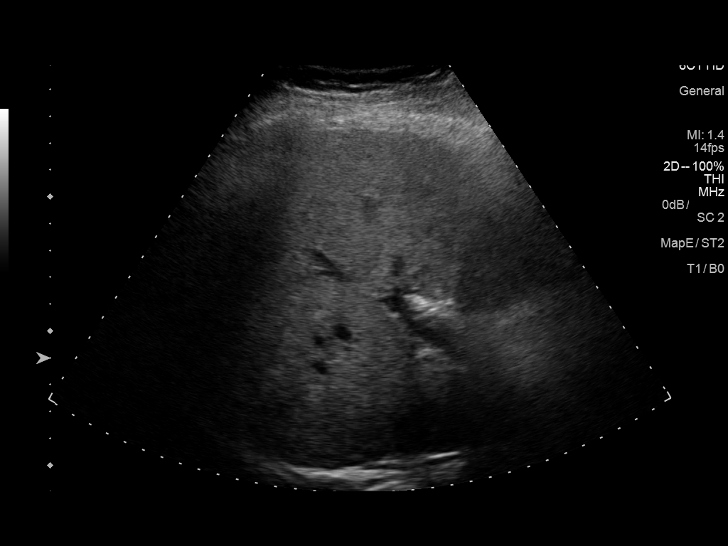
[im 49/59]
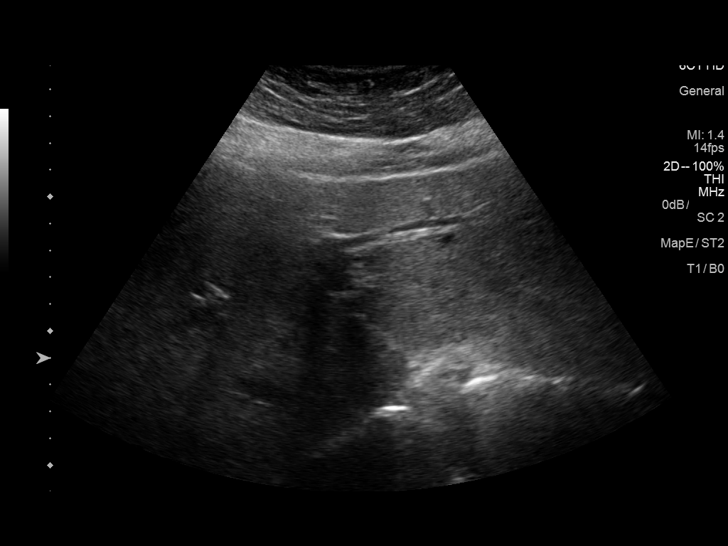
[im 54/59]
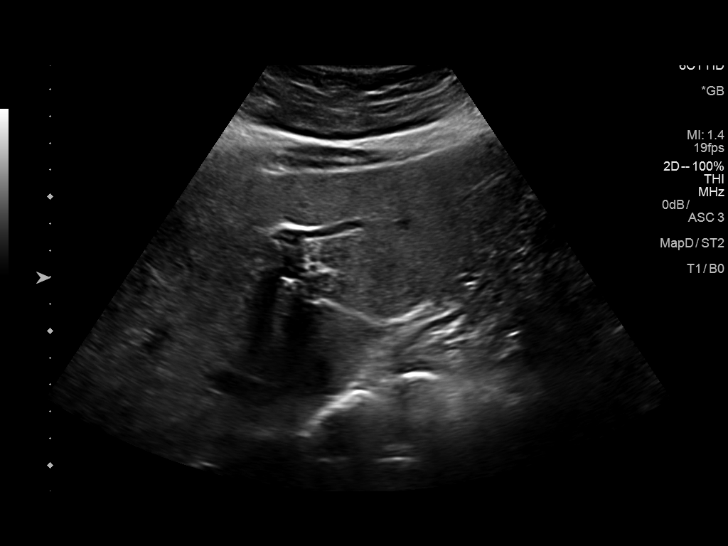
[im 59/59]
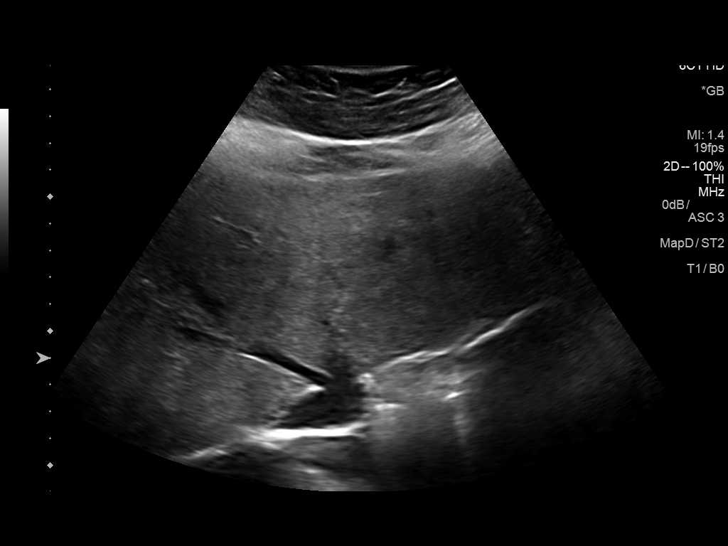

[13 of 25 positions shown; findings below may reference images not displayed]

FINDINGS: Gallbladder:

No gallstones or wall thickening visualized. There is no
pericholecystic fluid. No sonographic Murphy sign noted by
sonographer.

Common bile duct:

Diameter: 3 mm. No intrahepatic or extrahepatic biliary duct
dilatation.

Liver:

There is a hypoechoic mass in the medial segment of the left lower
lobe measuring 1.7 x 1.4 x 1.4 cm. A nearby second similar-appearing
mass also measures 1.7 x 1.4 x 1.4 cm. No other focal liver lesions
are evident. The parenchymal echogenicity is increased. The liver
has a subtly nodular contour. Portal vein is patent on color Doppler
imaging with normal direction of blood flow towards the liver.
IMPRESSION: The liver has an appearance raising concern for a degree of
underlying cirrhosis/parenchymal liver disease. There are 2
hypoechoic masses in the medial segment of the left lower lobe which
are hypoechoic. Etiology of these lesions is uncertain. Given this
sonographic appearance, correlation with pre and serial
post-contrast liver MR or CT advised to further evaluate.

Study otherwise unremarkable.

These results will be called to the ordering clinician or
representative by the Radiologist Assistant, and communication
documented in the PACS or zVision Dashboard.

## 2018-02-22 ENCOUNTER — Other Ambulatory Visit: Payer: Self-pay | Admitting: Internal Medicine

## 2018-02-22 DIAGNOSIS — Z1231 Encounter for screening mammogram for malignant neoplasm of breast: Secondary | ICD-10-CM

## 2018-03-12 ENCOUNTER — Ambulatory Visit
Admission: RE | Admit: 2018-03-12 | Discharge: 2018-03-12 | Disposition: A | Discharge status: Home / Self Care | Payer: BC Managed Care – PPO | Source: Ambulatory Visit | Attending: Internal Medicine | Admitting: Internal Medicine

## 2018-03-12 DIAGNOSIS — Z1231 Encounter for screening mammogram for malignant neoplasm of breast: Secondary | ICD-10-CM

## 2019-01-26 ENCOUNTER — Other Ambulatory Visit (HOSPITAL_COMMUNITY): Payer: Self-pay | Admitting: Internal Medicine

## 2019-01-26 ENCOUNTER — Other Ambulatory Visit: Payer: Self-pay | Admitting: Internal Medicine

## 2019-01-26 DIAGNOSIS — Z8249 Family history of ischemic heart disease and other diseases of the circulatory system: Secondary | ICD-10-CM

## 2019-02-02 ENCOUNTER — Encounter: Payer: Self-pay | Admitting: Internal Medicine

## 2019-02-08 ENCOUNTER — Ambulatory Visit (HOSPITAL_COMMUNITY): Payer: BC Managed Care – PPO | Attending: Cardiology

## 2019-02-08 DIAGNOSIS — Z8249 Family history of ischemic heart disease and other diseases of the circulatory system: Secondary | ICD-10-CM | POA: Diagnosis not present

## 2019-02-14 ENCOUNTER — Other Ambulatory Visit: Payer: Self-pay | Admitting: Internal Medicine

## 2019-02-14 DIAGNOSIS — Z1231 Encounter for screening mammogram for malignant neoplasm of breast: Secondary | ICD-10-CM

## 2019-03-15 ENCOUNTER — Ambulatory Visit: Payer: BC Managed Care – PPO

## 2019-05-24 ENCOUNTER — Other Ambulatory Visit: Payer: Self-pay | Admitting: Podiatry

## 2019-05-24 ENCOUNTER — Ambulatory Visit (INDEPENDENT_AMBULATORY_CARE_PROVIDER_SITE_OTHER): Payer: BC Managed Care – PPO

## 2019-05-24 ENCOUNTER — Ambulatory Visit: Payer: BC Managed Care – PPO | Admitting: Podiatry

## 2019-05-24 ENCOUNTER — Other Ambulatory Visit: Payer: Self-pay

## 2019-05-24 ENCOUNTER — Ambulatory Visit
Admission: RE | Admit: 2019-05-24 | Discharge: 2019-05-24 | Disposition: A | Discharge status: Home / Self Care | Payer: BC Managed Care – PPO | Source: Ambulatory Visit | Attending: Internal Medicine | Admitting: Internal Medicine

## 2019-05-24 ENCOUNTER — Ambulatory Visit: Payer: BC Managed Care – PPO

## 2019-05-24 DIAGNOSIS — M779 Enthesopathy, unspecified: Secondary | ICD-10-CM

## 2019-05-24 DIAGNOSIS — M2041 Other hammer toe(s) (acquired), right foot: Secondary | ICD-10-CM

## 2019-05-24 DIAGNOSIS — Z1231 Encounter for screening mammogram for malignant neoplasm of breast: Secondary | ICD-10-CM

## 2019-05-24 DIAGNOSIS — M2042 Other hammer toe(s) (acquired), left foot: Secondary | ICD-10-CM

## 2019-05-24 DIAGNOSIS — M21619 Bunion of unspecified foot: Secondary | ICD-10-CM

## 2019-05-25 ENCOUNTER — Other Ambulatory Visit: Payer: Self-pay | Admitting: Internal Medicine

## 2019-05-25 DIAGNOSIS — R928 Other abnormal and inconclusive findings on diagnostic imaging of breast: Secondary | ICD-10-CM

## 2019-05-31 NOTE — Progress Notes (Signed)
Subjective: 53 year old female presents the office today for concerns of pain to her right fourth and fifth toe she feels that she has a transfer of the area.  She has had previous surgery on the toes.  The toes are rubbing causing discomfort in between the toes.  She starting also get some left foot as well.  No recent injury. Denies any systemic complaints such as fevers, chills, nausea, vomiting. No acute changes since last appointment, and no other complaints at this time.   Objective: AAO x3, NAD DP/PT pulses palpable bilaterally, CRT less than 3 seconds There is tenderness palpation on the interspace between the fourth and fifth toes with the right side worse than left is a probable small bone spur on the lateral aspect the fifth digit.  There is no significant hyperkeratotic tissue present today.  There is no edema, erythema.  No open lesions.  Adductovarus present of the digits. No open lesions or pre-ulcerative lesions.  No pain with calf compression, swelling, warmth, erythema  Assessment: Digital deformity resulting in discomfort with bone spur right fifth toe  Plan: -All treatment options discussed with the patient including all alternatives, risks, complications.  -X-rays were obtained and reviewed bilaterally.  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  There is a bone spur present on the lateral aspect of the fifth digit proximal phalanx. -There is discussion regards with conservative as well as surgical treatment options.  For now discussed toe separators, wider shoes.  Ultimately discussed surgical intervention to help straighten out the the digits.  She like to do this later this year she desires to do this. For now we will continue with conservative care.  Trula Slade DPM

## 2019-06-01 ENCOUNTER — Other Ambulatory Visit: Payer: Self-pay

## 2019-06-01 ENCOUNTER — Other Ambulatory Visit: Payer: Self-pay | Admitting: Internal Medicine

## 2019-06-01 ENCOUNTER — Ambulatory Visit
Admission: RE | Admit: 2019-06-01 | Discharge: 2019-06-01 | Disposition: A | Discharge status: Home / Self Care | Payer: BC Managed Care – PPO | Source: Ambulatory Visit | Attending: Internal Medicine | Admitting: Internal Medicine

## 2019-06-01 ENCOUNTER — Ambulatory Visit: Payer: BC Managed Care – PPO

## 2019-06-01 DIAGNOSIS — R928 Other abnormal and inconclusive findings on diagnostic imaging of breast: Secondary | ICD-10-CM

## 2019-11-05 ENCOUNTER — Encounter: Payer: Self-pay | Admitting: Emergency Medicine

## 2019-11-05 ENCOUNTER — Other Ambulatory Visit: Payer: Self-pay

## 2019-11-05 DIAGNOSIS — Z79899 Other long term (current) drug therapy: Secondary | ICD-10-CM | POA: Insufficient documentation

## 2019-11-05 DIAGNOSIS — L309 Dermatitis, unspecified: Secondary | ICD-10-CM | POA: Diagnosis not present

## 2019-11-05 DIAGNOSIS — L299 Pruritus, unspecified: Secondary | ICD-10-CM | POA: Diagnosis present

## 2019-11-05 NOTE — ED Triage Notes (Signed)
Patient ambulatory to triage with complaints of redness and itching with minimal; swelling to hands and feet.  Pt reports that this happened 5 years prior and without explanation.    Pt reports taking the same type of meds that was given then at 2015, 2115, and  2215 pt took 25 mg hydroxyzine and 10 mg prednisolone and has received significant relief to s/sx.  Denies threats to airway.  Speaking in complete coherent sentences. No acute breathing distress noted.

## 2019-11-06 ENCOUNTER — Emergency Department
Admission: EM | Admit: 2019-11-06 | Discharge: 2019-11-06 | Disposition: A | Discharge status: Home / Self Care | Payer: BC Managed Care – PPO | Attending: Emergency Medicine | Admitting: Emergency Medicine

## 2019-11-06 DIAGNOSIS — L309 Dermatitis, unspecified: Secondary | ICD-10-CM

## 2019-11-06 MED ORDER — PREDNISONE 20 MG PO TABS: 40.0000 mg | ORAL_TABLET | Freq: Every day | ORAL | 0 refills | Status: AC | PRN

## 2019-11-06 MED ORDER — DIPHENHYDRAMINE HCL 25 MG PO CAPS
50.0000 mg | ORAL_CAPSULE | Freq: Once | ORAL | Status: AC
  Administered 2019-11-06: 50 mg via ORAL
  Filled 2019-11-06: qty 2

## 2019-11-06 MED ORDER — DEXAMETHASONE SODIUM PHOSPHATE 10 MG/ML IJ SOLN
6.0000 mg | Freq: Once | INTRAMUSCULAR | Status: AC
  Administered 2019-11-06: 6 mg via INTRAMUSCULAR
  Filled 2019-11-06: qty 1

## 2019-11-06 NOTE — ED Provider Notes (Signed)
[facility] Emergency Department Provider Note  ____________________________________________  Time seen: Approximately 12:20 AM  I have reviewed the triage vital signs and the nursing notes.   HISTORY  Chief Complaint No chief complaint on file.   HPI Alexa [name] is a 53 y.o. female with history as listed below who presents for evaluation of a itching and swelling of hands and feet.  Symptoms started earlier today.  Patient has had similar presentation in the past.  Told she had contact dermatitis from an unknown substance.  She reports that she usually has a flare once a year.  Started earlier today with itching of hands and feet.  Her hands and feet then turned red and swell up.  She took 10 mg of prednisone and 25 mg of hydroxyzine.  She reports that her symptoms are improving.  No hives, no shortness of breath, no stridor, no throat closing sensation, no angioedema, no vomiting or diarrhea.   Patient denies any new medication, lotions, or any other exposure.  Past Medical History:  Diagnosis Date  . Anxiety    Hx of panic attack, resolved on medicines  . Elevated LFTs   . Fatty liver 03/27/09  . IBS (irritable bowel syndrome)   . Liver lesion 2012   benign  . Melanoma ([place])   . Melanoma ([place]) 2011  . Melanoma of left upper arm ([place])    Resected, and no further RX. Dr. Crista Luria  . Migraines   . SVT (supraventricular tachycardia) ([place])    None since 2009    Patient Active Problem List   Diagnosis Date Noted  . Foot pain, bilateral 11/04/2015  . Migraine with aura and without status migrainosus, not intractable 01/25/2015  . S/P appendectomy 10/25/2012  . Migraine headache 10/25/2012  . Acute appendicitis 06/07/2012  . ANXIETY 05/03/2009  . SVT/ PSVT/ PAT 05/03/2009  . PALPITATIONS 05/03/2009  . CHEST PAIN-UNSPECIFIED 05/03/2009    Past Surgical History:  Procedure Laterality Date  . APPENDECTOMY    . AUGMENTATION MAMMAPLASTY  Bilateral 2007   saline implants pre- prectoral  . BREAST BIOPSY Left 11/21/2014  . BREAST SURGERY  2006   Breast implants  . CESAREAN SECTION     two  . LAPAROSCOPIC APPENDECTOMY  06/07/2012   Procedure: APPENDECTOMY LAPAROSCOPIC;  Surgeon: Adin Hector, MD;  Location: [facility];  Service: General;  Laterality: N/A;  . melanoma resection    . MOLE REMOVAL     multiple  . TUBAL LIGATION      Prior to Admission medications   Medication Sig Start Date End Date Taking? Authorizing Provider  AJOVY 225 MG/1.5ML SOSY  05/16/19   [provider]  ALPRAZolam Duanne Moron) 0.5 MG tablet Take 1/2-1 tablet by mouth three times daily as needed for anxiety    [provider]  atenolol (TENORMIN) 25 MG tablet  12/30/18   [provider]  Calcium-Magnesium-Zinc ([employer]S CALCIUM-MAGNESIUM-ZINC) 333-133-5 MG TABS Take by mouth.    [provider]  CALCIUM-MAGNESIUM-ZINC PO Take 1 capsule by mouth daily.    [provider]  Cholecalciferol (VITAMIN D3) 1000 UNITS CAPS Take 1 capsule by mouth daily. 4000 units    [provider]  Diclofenac Potassium 50 MG PACK Take 50 mg by mouth as needed. 01/25/15   Marcial Pacas, MD  escitalopram (LEXAPRO) 20 MG tablet Take 10 mg by mouth daily.     [provider]  Multiple Vitamin (MULTIVITAMIN) tablet Take 1 tablet by mouth daily.  [provider]  NON FORMULARY livercare-cleanser one daily    [provider]  nortriptyline (PAMELOR) 10 MG capsule One po qhs xone week, then 2 tabs po qhs 01/25/15   Marcial Pacas, MD  OVER THE COUNTER MEDICATION Nature thyroid (for thyroid) one daily    [provider]  [facility], 1 MG/DOSE, 2 MG/1.5ML [facility]  04/20/19   [provider]  predniSONE (DELTASONE) 20 MG tablet Take 2 tablets (40 mg total) by mouth daily as needed for up to 4 days (dermatitis). 11/06/19 11/10/19  Rudene Re, MD  Riboflavin (VITAMIN B-2 PO) Take 2 tablets by mouth daily as  needed.    [provider]  rosuvastatin (CRESTOR) 5 MG tablet  04/28/19   [provider]  SUMAtriptan (IMITREX) 100 MG tablet Take 100 mg by mouth as needed.    [provider]  valACYclovir (VALTREX) 500 MG tablet Take 500 mg by mouth as needed (Fever Blisters).    [provider]    Allergies Zithromax [azithromycin]  Family History  Problem Relation Age of Onset  . Coronary artery disease Father   . Osteoarthritis Mother   . Breast cancer Maternal Grandmother 46  . Colon cancer Neg Hx     Social History Social History   Tobacco Use  . Smoking status: Never Smoker  . Smokeless tobacco: Never Used  Substance Use Topics  . Alcohol use: Yes    Comment: rare  . Drug use: No    Review of Systems  Constitutional: Negative for fever. Eyes: Negative for visual changes. ENT: Negative for sore throat. Neck: No neck pain  Cardiovascular: Negative for chest pain. Respiratory: Negative for shortness of breath. Gastrointestinal: Negative for abdominal pain, vomiting or diarrhea. Genitourinary: Negative for dysuria. Musculoskeletal: Negative for back pain. + b/l feet and hand redness, swelling, and pruritus Skin: Negative for rash. Neurological: Negative for headaches, weakness or numbness. Psych: No SI or HI  ____________________________________________   PHYSICAL EXAM:  VITAL SIGNS: ED Triage Vitals  Enc Vitals Group     BP 11/05/19 2335 134/82     Pulse Rate 11/05/19 2335 83     Resp 11/05/19 2335 17     Temp 11/05/19 2335 98.1 F (36.7 C)     Temp Source 11/05/19 2335 Oral     SpO2 11/05/19 2335 100 %     Weight 11/05/19 2336 205 lb (93 kg)     Height 11/05/19 2336 5\' 9"  (1.753 m)     Head Circumference --      Peak Flow --      Pain Score 11/05/19 2356 0     Pain Loc --      Pain Edu? --      Excl. in [place]? --     Constitutional: Alert and oriented. Well appearing and in no apparent distress. HEENT:      Head:  Normocephalic and atraumatic.         Eyes: Conjunctivae are normal. Sclera is non-icteric.       Mouth/Throat: Mucous membranes are moist.  No angioedema, tongue and uvula are normal size and midline, no stridor, airways patent      Neck: Supple with no signs of meningismus. Cardiovascular: Regular rate and rhythm.  Respiratory: Normal respiratory effort. Lungs are clear to auscultation bilaterally. No wheezes, crackles, or rhonchi.  Musculoskeletal: Bilateral hands are normal.  Patient does have mild swelling and erythema of bilateral feet with normal pulses and brisk capillary refill. Neurologic: Normal speech  and language. Face is symmetric. Moving all extremities. No gross focal neurologic deficits are appreciated. Skin: Skin is warm, dry and intact. No rash noted. Psychiatric: Mood and affect are normal. Speech and behavior are normal.  ____________________________________________   LABS (all labs ordered are listed, but only abnormal results are displayed)  Labs Reviewed - No data to display ____________________________________________  EKG  none  ____________________________________________  RADIOLOGY  none  ____________________________________________   PROCEDURES  Procedure(s) performed: None Procedures Critical Care performed:  None ____________________________________________   INITIAL IMPRESSION / ASSESSMENT AND PLAN / ED COURSE   53 y.o. female with history as listed below who presents for evaluation of a itching and swelling of hands and feet.  Patient has mild swelling and erythema of bilateral feet consistent with dermatitis.  Patient requesting a dose of IM steroids.  Will give a dose of IM Decadron and discharge patient home on prednisone as needed and Benadryl.  Discussed standard return precautions and follow-up with PCP.      As part of my medical decision making, I reviewed the following data within the [facility] notes  reviewed and incorporated, Old chart reviewed, Notes from prior ED visits and [place] Controlled Substance Database   Please note:  Patient was evaluated in Emergency Department today for the symptoms described in the history of present illness. Patient was evaluated in the context of the global COVID-19 pandemic, which necessitated consideration that the patient might be at risk for infection with the SARS-CoV-2 virus that causes COVID-19. Institutional protocols and algorithms that pertain to the evaluation of patients at risk for COVID-19 are in a state of rapid change based on information released by regulatory bodies including the CDC and federal and state organizations. These policies and algorithms were followed during the patient's care in the ED.  Some ED evaluations and interventions may be delayed as a result of limited staffing during the pandemic.   ____________________________________________   FINAL CLINICAL IMPRESSION(S) / ED DIAGNOSES   Final diagnoses:  Dermatitis      NEW MEDICATIONS STARTED DURING THIS VISIT:  ED Discharge Orders         Ordered    predniSONE (DELTASONE) 20 MG tablet  Daily PRN     11/06/19 0019           Note:  This document was prepared using Dragon voice recognition software and may include unintentional dictation errors.    Alfred Levins, [place], MD 11/06/19 [phone]

## 2019-11-06 NOTE — Discharge Instructions (Signed)
Take 40 mg of prednisone once a day as needed for your symptoms. Take 50mg  of benadryl every 6 hours as needed. Return to the ER for lip, tongue swelling, difficulty breathing, throat closing sensation. Otherwise follow up with your doctor for further evaluation in 3 days.

## 2019-12-08 ENCOUNTER — Ambulatory Visit: Payer: BC Managed Care – PPO | Admitting: Podiatry

## 2019-12-08 ENCOUNTER — Other Ambulatory Visit: Payer: Self-pay

## 2019-12-08 DIAGNOSIS — E539 Vitamin B deficiency, unspecified: Secondary | ICD-10-CM | POA: Diagnosis not present

## 2019-12-09 ENCOUNTER — Encounter: Payer: Self-pay | Admitting: Neurology

## 2019-12-09 ENCOUNTER — Ambulatory Visit: Payer: BC Managed Care – PPO | Admitting: Podiatry

## 2019-12-09 ENCOUNTER — Telehealth: Payer: Self-pay | Admitting: *Deleted

## 2019-12-09 ENCOUNTER — Other Ambulatory Visit: Payer: Self-pay

## 2019-12-09 DIAGNOSIS — E539 Vitamin B deficiency, unspecified: Secondary | ICD-10-CM

## 2019-12-09 NOTE — Progress Notes (Signed)
Subjective: 53 year old female presents the office today for concerns of burning, hot sensations to both of her feet and they get red and hot to touch.  This is been ongoing for a long time and she is seen her primary care physician for this and she was diagnosed with "burning feet syndrome".  Not had any treatment for this.  She states that it gets to the point where she has pain but not where she needs pain medication for it.  She also states that her feet swell and humid environments.  She states that the toes never turn purple or white.  No recent injury to her feet.  Denies any systemic complaints such as fevers, chills, nausea, vomiting. No acute changes since last appointment, and no other complaints at this time.   Objective: AAO x3, NAD DP/PT pulses palpable bilaterally, CRT less than 3 seconds Sensation intact with Semmes Weinstein monofilament.  Mild decrease with vibratory sensation. There is mild swelling to the toes as well as erythematous changes.  This is a chronic issue for her and there is no skin breakdown or evidence of gangrene. No open lesions or pre-ulcerative lesions.  No pain with calf compression, swelling, warmth, erythema  Assessment: Burning feet syndrome  Plan: -All treatment options discussed with the patient including all alternatives, risks, complications.  -At this time recommended nerve conduction test.  We also discussed other issues that can cause this.  For now we will start with a nerve conduction test.  If negative will do an autoimmune work-up and possible neurology consult.  -Patient encouraged to call the office with any questions, concerns, change in symptoms.   Trula Slade DPM

## 2019-12-09 NOTE — Telephone Encounter (Signed)
-----   Message from Trula Slade, DPM sent at 12/08/2019  5:28 PM EST ----- Can you please order a nerve conduction test due to "burning feet syndrome"? Can you do Dr. Serita Grit office? Thanks.

## 2019-12-09 NOTE — Telephone Encounter (Signed)
Faxed orders, clinicals and demographics to [facility]. 

## 2019-12-28 ENCOUNTER — Telehealth: Payer: Self-pay | Admitting: *Deleted

## 2019-12-28 NOTE — Telephone Encounter (Signed)
Left message requesting a call back to discuss her questions.

## 2019-12-28 NOTE — Telephone Encounter (Signed)
Pt states Dr. Jacqualyn Posey had referred pt to Dr. Posey Pronto and she had questions.

## 2019-12-29 ENCOUNTER — Ambulatory Visit: Payer: BC Managed Care – PPO | Admitting: Podiatry

## 2019-12-30 ENCOUNTER — Telehealth: Payer: Self-pay | Admitting: Podiatry

## 2019-12-30 ENCOUNTER — Telehealth: Payer: Self-pay | Admitting: *Deleted

## 2019-12-30 DIAGNOSIS — E539 Vitamin B deficiency, unspecified: Secondary | ICD-10-CM

## 2019-12-30 NOTE — Telephone Encounter (Signed)
-----   Message from Trula Slade, DPM sent at 12/29/2019  5:07 PM EST ----- Regarding: FW: Referral Val- I responded to Dr. Posey Pronto but can you put in a referral for a new patient consult? Thanks.  ----- Message ----- From: Alda Berthold, DO Sent: 12/29/2019  10:06 AM EST To: Cecille Rubin, DPM Subject: Referral                                       Hi Dr. Jacqualyn Posey,  I hope you are well.  Thank you for making a referral for this patient to have EMG with me.  She contacted my office to get an idea of cost and as I was reviewing your notes to see how many nerves I would be testing, I noticed that her symptoms may be more suggestive of erythromelalgia.   If it is okay with you, do you mind if I see her her as a NP consult first and then decide if EMG is needed?  Often times, if the exam is helpful to make the diagnosis.  Either way is fine with me, but since I had a chance to review the chart, wanted to share my thoughts with you.  Let me know if you would like [place] to switch the appointment to NP or keep as EMG.  Thanks,  [employer]

## 2019-12-30 NOTE — Telephone Encounter (Signed)
Pt was calling to see about insurance

## 2019-12-30 NOTE — Telephone Encounter (Signed)
-----   Message from Trula Slade, DPM sent at 12/29/2019  5:06 PM EST ----- Regarding: RE: Referral Dr. Posey Pronto,  It is fine to switch to a NP to see if the EMG is necessary.   In general would you prefer to see them before I order a EMG or just dependent on the patient?   Thanks for all of your help!  Colvin Caroli ----- Message ----- From: Alda Berthold, DO Sent: 12/29/2019  10:06 AM EST To: Cecille Rubin, DPM Subject: Referral                                       Hi Dr. Jacqualyn Posey,  I hope you are well.  Thank you for making a referral for this patient to have EMG with me.  She contacted my office to get an idea of cost and as I was reviewing your notes to see how many nerves I would be testing, I noticed that her symptoms may be more suggestive of erythromelalgia.   If it is okay with you, do you mind if I see her her as a NP consult first and then decide if EMG is needed?  Often times, if the exam is helpful to make the diagnosis.  Either way is fine with me, but since I had a chance to review the chart, wanted to share my thoughts with you.  Let me know if you would like [place] to switch the appointment to NP or keep as EMG.  Thanks,  [employer]

## 2019-12-30 NOTE — Telephone Encounter (Signed)
I called pt and informed fo Dr. Jacqualyn Posey and Dr. Keturah Barre. Patel's correspondence and recommendations. Pt states understanding. Faxed referral, clinicals and demographics to [facility].

## 2020-01-11 ENCOUNTER — Encounter: Payer: BC Managed Care – PPO | Admitting: Neurology

## 2020-01-23 ENCOUNTER — Ambulatory Visit: Payer: BC Managed Care – PPO | Admitting: Neurology

## 2020-01-24 ENCOUNTER — Encounter: Payer: BC Managed Care – PPO | Admitting: Neurology

## 2020-03-12 ENCOUNTER — Encounter: Payer: Self-pay | Admitting: Neurology

## 2020-03-12 ENCOUNTER — Ambulatory Visit: Payer: BC Managed Care – PPO | Admitting: Neurology

## 2020-03-12 ENCOUNTER — Other Ambulatory Visit: Payer: Self-pay

## 2020-03-12 VITALS — BP 129/82 | HR 95 | Resp 18 | Ht 69.0 in | Wt 200.0 lb

## 2020-03-12 DIAGNOSIS — E539 Vitamin B deficiency, unspecified: Secondary | ICD-10-CM | POA: Diagnosis not present

## 2020-03-12 NOTE — Patient Instructions (Signed)
If your symptoms get worse or you developed numbness/tingling of the feet, please call the office for nerve testing

## 2020-03-12 NOTE — Progress Notes (Signed)
[facility] Clinic Note - Initial Visit   Date: 03/12/20  Alexa [name] MRN: [id number] DOB: [birth year]   Dear Dr. Ardelle Anton:  Thank you for your kind referral of Alexa [name] for consultation of bilateral feet pain. Although her history is well known to you, please allow [place] to reiterate it for the purpose of our medical record. The patient was accompanied to the clinic by self.  History of Present Illness: Alexa [name] is a 54 y.o. right-handed female with migraine, anxiety, SVT, and melanoma of the skin presenting for evaluation of burning feet.   For the past 15 years, she has sensation of heat and reddness involving the feet.  There is no associated pain, tingling, or numbness.  She has some ankle swelling, which usually occurs when the humidity is high or when travelling.  She is unable to tolerate tight shoes such as tennis shoes.  Symptoms occur about 2-4 times per month, and last several hours until she is able to cool off her feet. No associated weakness or imbalance.    Past Medical History:  Diagnosis Date  . Anxiety    Hx of panic attack, resolved on medicines  . Elevated LFTs   . Fatty liver 03/27/09  . IBS (irritable bowel syndrome)   . Liver lesion 2012   benign  . Melanoma ([facility])   . Melanoma ([facility]) 2011  . Melanoma of left upper arm ([facility])    Resected, and no further RX. Dr. Campbell Stall  . Migraines   . SVT (supraventricular tachycardia) ([facility])    None since 2009    Past Surgical History:  Procedure Laterality Date  . APPENDECTOMY    . AUGMENTATION MAMMAPLASTY Bilateral 2007   saline implants pre- prectoral  . BREAST BIOPSY Left 11/21/2014  . BREAST SURGERY  2006   Breast implants  . CESAREAN SECTION     two  . LAPAROSCOPIC APPENDECTOMY  06/07/2012   Procedure: APPENDECTOMY LAPAROSCOPIC;  Surgeon: Ardeth Sportsman, MD;  Location: [facility];  Service: General;  Laterality: N/A;  . melanoma resection    . MOLE REMOVAL     multiple  .  TUBAL LIGATION       Medications:  Outpatient Encounter Medications as of 03/12/2020  Medication Sig  . AJOVY 225 MG/1.5ML SOSY   . ALPRAZolam (XANAX) 0.5 MG tablet Take 1/2-1 tablet by mouth three times daily as needed for anxiety  . atenolol (TENORMIN) 25 MG tablet   . Calcium-Magnesium-Zinc ([employer] CALCIUM-MAGNESIUM-ZINC) 333-133-5 MG TABS Take by mouth.  Marland Kitchen CALCIUM-MAGNESIUM-ZINC PO Take 1 capsule by mouth daily.  . Cholecalciferol (VITAMIN D3) 1000 UNITS CAPS Take 1 capsule by mouth daily. 4000 units  . Diclofenac Potassium 50 MG PACK Take 50 mg by mouth as needed.  Marland Kitchen escitalopram (LEXAPRO) 20 MG tablet Take 10 mg by mouth daily.   . Multiple Vitamin (MULTIVITAMIN) tablet Take 1 tablet by mouth daily.  . NON FORMULARY livercare-cleanser one daily  . nortriptyline (PAMELOR) 10 MG capsule One po qhs xone week, then 2 tabs po qhs  . OVER THE COUNTER MEDICATION Ashby Dawes thyroid (for thyroid) one daily  . OZEMPIC, 1 MG/DOSE, 2 MG/1.5ML SOPN   . Riboflavin (VITAMIN B-2 PO) Take 2 tablets by mouth daily as needed.  . rosuvastatin (CRESTOR) 5 MG tablet   . SUMAtriptan (IMITREX) 100 MG tablet Take 100 mg by mouth as needed.  . valACYclovir (VALTREX) 500 MG tablet Take 500 mg by mouth as needed (Fever Blisters).  No facility-administered encounter medications on file as of 03/12/2020.    Allergies:  Allergies  Allergen Reactions  . Zithromax [Azithromycin] Other (See Comments)    Thrush    Family History: Family History  Problem Relation Age of Onset  . Coronary artery disease Father   . Osteoarthritis Mother   . Breast cancer Maternal Grandmother 40  . Colon cancer Neg Hx     Social History: Social History   Tobacco Use  . Smoking status: Never Smoker  . Smokeless tobacco: Never Used  Substance Use Topics  . Alcohol use: Yes    Comment: rare  . Drug use: No   Social History   Social History Narrative   Lives at home with husband.   Right-handed.   1 cup caffeine/day.     One story home    Vital Signs:  BP 129/82   Pulse 95   Resp 18   Ht 5\' 9"  (1.753 m)   Wt 200 lb (90.7 kg)   SpO2 100%   BMI 29.53 kg/m   Neurological Exam: MENTAL STATUS including orientation to time, place, person, recent and remote memory, attention span and concentration, language, and fund of knowledge is normal.  Speech is not dysarthric.  CRANIAL NERVES: II:  No visual field defects.   III-IV-VI: Pupils equal round and reactive to light.  Normal conjugate, extra-ocular eye movements in all directions of gaze.  No nystagmus.  No ptosis.   VII:  Normal facial symmetry and movements.   VIII:  Normal hearing and vestibular function.   IX-X:  Normal palatal movement.   XI:  Normal shoulder shrug and head rotation.    MOTOR:  No atrophy, fasciculations or abnormal movements.  No pronator drift.  No skin discoloration, swelling, or warmth of the feet.   Upper Extremity:  Right  Left  Deltoid  5/5   5/5   Biceps  5/5   5/5   Triceps  5/5   5/5   Infraspinatus 5/5  5/5  Medial pectoralis 5/5  5/5  Wrist extensors  5/5   5/5   Wrist flexors  5/5   5/5   Finger extensors  5/5   5/5   Finger flexors  5/5   5/5   Dorsal interossei  5/5   5/5   Abductor pollicis  5/5   5/5   Tone (Ashworth scale)  0  0   Lower Extremity:  Right  Left  Hip flexors  5/5   5/5   Hip extensors  5/5   5/5   Adductor 5/5  5/5  Abductor 5/5  5/5  Knee flexors  5/5   5/5   Knee extensors  5/5   5/5   Dorsiflexors  5/5   5/5   Plantarflexors  5/5   5/5   Toe extensors  5/5   5/5   Toe flexors  5/5   5/5   Tone (Ashworth scale)  0  0   MSRs:  Right        Left                  brachioradialis 2+  2+  biceps 2+  2+  triceps 2+  2+  patellar 2+  2+  ankle jerk 2+  2+  Hoffman no  no  plantar response down  down   SENSORY:  Normal and symmetric perception of light touch, pinprick, vibration, and proprioception.   COORDINATION/GAIT: Normal finger-to- nose-finger.  Intact rapid  alternating movements bilaterally.  Gait narrow based and stable. Tandem and stressed gait intact.   IMPRESSION: Dysesthesias of the feet with associated erythema.  She has a normal neurological exam.  Patient was reassured that I do not appreciate any signs of neuropathy and therefore, electrodiagnostic testing will likely be of low-value. Erythromelalgia is another possibility, but I would expect her to have much more severe symptoms of swelling and pain.  Fortunately, her symptoms are self-limiting, intermittent, and have not progressed over the year.  She will continue to monitor and if she develops worsening symptoms or paresthesias, NCS/EMG can be considered at that time.   Thank you for allowing me to participate in patient's care.  If I can answer any additional questions, I would be pleased to do so.    Sincerely,    Egan Sahlin K. Allena Katz, DO

## 2020-05-01 ENCOUNTER — Other Ambulatory Visit: Payer: Self-pay | Admitting: Internal Medicine

## 2020-05-01 DIAGNOSIS — Z1231 Encounter for screening mammogram for malignant neoplasm of breast: Secondary | ICD-10-CM

## 2020-06-13 ENCOUNTER — Ambulatory Visit
Admission: RE | Admit: 2020-06-13 | Discharge: 2020-06-13 | Disposition: A | Discharge status: Home / Self Care | Payer: BC Managed Care – PPO | Source: Ambulatory Visit | Attending: Internal Medicine | Admitting: Internal Medicine

## 2020-06-13 ENCOUNTER — Other Ambulatory Visit: Payer: Self-pay

## 2020-06-13 ENCOUNTER — Other Ambulatory Visit: Payer: Self-pay | Admitting: Internal Medicine

## 2020-06-13 DIAGNOSIS — Z1231 Encounter for screening mammogram for malignant neoplasm of breast: Secondary | ICD-10-CM

## 2021-05-08 ENCOUNTER — Other Ambulatory Visit: Payer: Self-pay | Admitting: Internal Medicine

## 2021-05-08 DIAGNOSIS — Z1231 Encounter for screening mammogram for malignant neoplasm of breast: Secondary | ICD-10-CM

## 2021-06-19 ENCOUNTER — Other Ambulatory Visit: Payer: Self-pay

## 2021-06-19 ENCOUNTER — Ambulatory Visit (AMBULATORY_SURGERY_CENTER): Payer: BC Managed Care – PPO

## 2021-06-19 DIAGNOSIS — Z1211 Encounter for screening for malignant neoplasm of colon: Secondary | ICD-10-CM

## 2021-06-19 MED ORDER — PLENVU 140 G PO SOLR: 1.0000 | ORAL | 0 refills | Status: DC

## 2021-06-19 MED ORDER — ONDANSETRON HCL 4 MG PO TABS
4.0000 mg | ORAL_TABLET | ORAL | 0 refills | Status: DC
Start: 2021-06-19 — End: 2021-11-04

## 2021-06-19 NOTE — Progress Notes (Signed)
Patient's pre-visit was done today over the phone with the patient  Name,DOB and address verified. Patient denies any allergies to Eggs and Soy. Patient denies any problems with anesthesia/sedation. Patient denies taking diet pills or blood thinners. No home Oxygen. Packet of Prep instructions mailed to patient including a copy of a consent form-pt is aware. Patient understands to call us back with any questions or concerns. Patient is aware of our care-partner policy and [id number] safety protocol.   EMMI education assigned to the patient for the procedure, sent to [facility].   The patient is COVID-19 vaccinated, per patient.  Pt has had nausea and vomiting in the past with prep.  Zofran ordered and discussed with pt.

## 2021-07-03 ENCOUNTER — Telehealth: Payer: Self-pay | Admitting: Internal Medicine

## 2021-07-03 NOTE — Telephone Encounter (Signed)
Hi Dr. Henrene Pastor, this pt would like to continue her GI care with you. She used to see Dr. Olevia Perches and was scheduled for a colonoscopy with Dr. Candis Schatz on 07/12/21. However, due to some conflict she just cancelled it and is requesting to reschedule it with you. She stated that her husband and brother-in-law are pts of yours. Please advise if you would accept pt. Thank you.

## 2021-07-03 NOTE — Telephone Encounter (Signed)
Left message to call back to r/s colon with Dr. Henrene Pastor.

## 2021-07-03 NOTE — Telephone Encounter (Signed)
Okay to schedule screening colonoscopy with me.  Thanks

## 2021-07-08 ENCOUNTER — Ambulatory Visit: Payer: BC Managed Care – PPO

## 2021-07-11 ENCOUNTER — Ambulatory Visit
Admission: RE | Admit: 2021-07-11 | Discharge: 2021-07-11 | Disposition: A | Discharge status: Home / Self Care | Payer: BC Managed Care – PPO | Source: Ambulatory Visit | Attending: Internal Medicine | Admitting: Internal Medicine

## 2021-07-11 ENCOUNTER — Other Ambulatory Visit: Payer: Self-pay

## 2021-07-11 DIAGNOSIS — Z1231 Encounter for screening mammogram for malignant neoplasm of breast: Secondary | ICD-10-CM

## 2021-07-12 ENCOUNTER — Encounter: Payer: BC Managed Care – PPO | Admitting: Gastroenterology

## 2021-10-21 ENCOUNTER — Encounter: Payer: Self-pay | Admitting: Internal Medicine

## 2021-10-21 ENCOUNTER — Ambulatory Visit (AMBULATORY_SURGERY_CENTER): Payer: BC Managed Care – PPO

## 2021-10-21 ENCOUNTER — Other Ambulatory Visit: Payer: Self-pay

## 2021-10-21 VITALS — Ht 69.0 in | Wt 200.0 lb

## 2021-10-21 DIAGNOSIS — Z1211 Encounter for screening for malignant neoplasm of colon: Secondary | ICD-10-CM

## 2021-10-21 NOTE — Progress Notes (Signed)
   Patient's pre-visit was done today over the phone with the patient   Name,DOB and address verified.   Patient denies any allergies to Eggs and Soy.  Patient denies any problems with anesthesia/sedation. Patient denies taking diet pills or blood thinners.  Denies atrial flutter or atrial fib Denies chronic constipation No home Oxygen.   Packet of Prep instructions mailed to patient including a copy of a consent form-pt is aware.  Patient understands to call us back with any questions or concerns.  Patient is aware of our care-partner policy and [id number]-86 safety protocol.   EMMI education assigned to the patient for the procedure, sent to [facility].      Pt has Zofran and Plenvu on hand.

## 2021-11-04 ENCOUNTER — Other Ambulatory Visit: Payer: Self-pay

## 2021-11-04 ENCOUNTER — Encounter: Payer: Self-pay | Admitting: Internal Medicine

## 2021-11-04 ENCOUNTER — Ambulatory Visit (AMBULATORY_SURGERY_CENTER): Payer: BC Managed Care – PPO | Admitting: Internal Medicine

## 2021-11-04 VITALS — BP 109/69 | HR 69 | Temp 97.4°F | Resp 13 | Ht 69.0 in | Wt 200.0 lb

## 2021-11-04 DIAGNOSIS — D120 Benign neoplasm of cecum: Secondary | ICD-10-CM | POA: Diagnosis not present

## 2021-11-04 DIAGNOSIS — Z1211 Encounter for screening for malignant neoplasm of colon: Secondary | ICD-10-CM | POA: Diagnosis not present

## 2021-11-04 MED ORDER — SODIUM CHLORIDE 0.9 % IV SOLN: 500.0000 mL | Freq: Once | INTRAVENOUS | Status: DC

## 2021-11-04 NOTE — Progress Notes (Signed)
Called to room to assist during endoscopic procedure.  Patient ID and intended procedure confirmed with present staff. Received instructions for my participation in the procedure from the performing physician.  

## 2021-11-04 NOTE — Progress Notes (Signed)
HISTORY OF PRESENT ILLNESS:  Alexa [name] is a 55 y.o. female who presents today for screening colonoscopy.  No active complaints.  Previous examination elsewhere 2010 was normal  REVIEW OF SYSTEMS:  All non-GI ROS negative. Past Medical History:  Diagnosis Date   Anxiety    Hx of panic attack, resolved on medicines   Diabetes mellitus without complication ([place])    Elevated LFTs    Fatty liver 03/27/2009   IBS (irritable bowel syndrome)    Liver lesion 2012   benign   Melanoma ([place])    Melanoma ([place]) 2011   Melanoma of left upper arm ([facility])    Resected, and no further RX. Dr. Crista Luria   Migraines    SVT (supraventricular tachycardia) ([place])    None since 2009    Past Surgical History:  Procedure Laterality Date   APPENDECTOMY     AUGMENTATION MAMMAPLASTY Bilateral 2007   saline implants pre- prectoral   BREAST BIOPSY Left 11/21/2014   BREAST SURGERY  2006   Breast implants   CESAREAN SECTION     two   LAPAROSCOPIC APPENDECTOMY  06/07/2012   Procedure: APPENDECTOMY LAPAROSCOPIC;  Surgeon: Adin Hector, MD;  Location: [facility];  Service: General;  Laterality: N/A;   melanoma resection     MOLE REMOVAL     multiple   TUBAL LIGATION      Social History Alexa [name]  reports that she has never smoked. She has never used smokeless tobacco. She reports current alcohol use. She reports that she does not use drugs.  family history includes Breast cancer (age of onset: 57) in her maternal grandmother; Coronary artery disease in her father; Osteoarthritis in her mother.  Allergies  Allergen Reactions   Atorvastatin     Other reaction(s): abdominal pain   Celexa [Citalopram]     Other reaction(s): leg cramps   Zithromax [Azithromycin] Other (See Comments)    Thrush   Zolpidem Tartrate Er     Other reaction(s): "felt crazy"       PHYSICAL EXAMINATION:  Vital signs: BP 118/79   Pulse 79   Temp (!) 97.4 F (36.3 C)   Resp (!) 8   Ht 5\' 9"  (1.753 m)   Wt 200  lb (90.7 kg)   SpO2 100%   BMI 29.53 kg/m  General: Well-developed, well-nourished, no acute distress HEENT: Sclerae are anicteric, conjunctiva pink. Oral mucosa intact Lungs: Clear Heart: Regular Abdomen: soft, nontender, nondistended, no obvious ascites, no peritoneal signs, normal bowel sounds. No organomegaly. Extremities: No edema Psychiatric: alert and oriented x3. Cooperative     ASSESSMENT:  1.  Colon cancer screening.  Average risk   PLAN:   1.  Screening colonoscopy

## 2021-11-04 NOTE — Op Note (Signed)
Alexa [name] Patient Name: Alexa [name] Procedure Date: 11/04/2021 8:38 AM MRN: [id number] Endoscopist: Docia Chuck. Henrene Pastor , MD Age: 55 Referring MD:  Date of Birth: Aug 22, 1966 Gender: Female Account #: [id number] Procedure:                Colonoscopy with cold snare polypectomy x 1 Indications:              Screening for colorectal malignant neoplasm.                            Negative exam elsewhere 2010. Negative cologaurd 3                            yrs ago Medicines:                Monitored Anesthesia Care Procedure:                Pre-Anesthesia Assessment:                           - Prior to the procedure, a History and Physical                            was performed, and patient medications and                            allergies were reviewed. The patient's tolerance of                            previous anesthesia was also reviewed. The risks                            and benefits of the procedure and the sedation                            options and risks were discussed with the patient.                            All questions were answered, and informed consent                            was obtained. Prior Anticoagulants: The patient has                            taken no previous anticoagulant or antiplatelet                            agents. ASA Grade Assessment: II - A patient with                            mild systemic disease. After reviewing the risks                            and benefits, the patient was deemed in  satisfactory condition to undergo the procedure.                           After obtaining informed consent, the colonoscope                            was passed under direct vision. Throughout the                            procedure, the patient's blood pressure, pulse, and                            oxygen saturations were monitored continuously. The                            CF [id number] #[id number] was  introduced through the anus                            and advanced to the the cecum, identified by                            appendiceal orifice and ileocecal valve. The                            ileocecal valve, appendiceal orifice, and rectum                            were photographed. The quality of the bowel                            preparation was excellent. The colonoscopy was                            performed without difficulty. The patient tolerated                            the procedure well. The bowel preparation used was                            SUPREP via split dose instruction. Scope In: 8:50:23 AM Scope Out: 9:06:59 AM Scope Withdrawal Time: 0 hours 12 minutes 23 seconds  Total Procedure Duration: 0 hours 16 minutes 36 seconds  Findings:                 A 3 mm polyp was found in the cecum. The polyp was                            removed with a cold snare. Resection and retrieval                            were complete.                           The exam was otherwise without abnormality on  direct and retroflexion views. Complications:            No immediate complications. Estimated blood loss:                            None. Estimated Blood Loss:     Estimated blood loss: none. Impression:               - One 3 mm polyp in the cecum, removed with a cold                            snare. Resected and retrieved.                           - The examination was otherwise normal on direct                            and retroflexion views. Recommendation:           - Repeat colonoscopy in 7-10 years for surveillance.                           - Patient has a contact number available for                            emergencies. The signs and symptoms of potential                            delayed complications were discussed with the                            patient. Return to normal activities tomorrow.                             Written discharge instructions were provided to the                            patient.                           - Resume previous diet.                           - Continue present medications.                           - Await pathology results. Docia Chuck. Henrene Pastor, MD 11/04/2021 9:28:07 AM This report has been signed electronically.

## 2021-11-04 NOTE — Progress Notes (Signed)
Report to PACU, RN, vss, BBS= Clear.  

## 2021-11-04 NOTE — Patient Instructions (Addendum)
YOU HAD AN ENDOSCOPIC PROCEDURE TODAY AT THE [facility]:   Refer to the procedure report that was given to you for any specific questions about what was found during the examination.  If the procedure report does not answer your questions, please call your gastroenterologist to clarify.  If you requested that your care partner not be given the details of your procedure findings, then the procedure report has been included in a sealed envelope for you to review at your convenience later.  YOU SHOULD EXPECT: Some feelings of bloating in the abdomen. Passage of more gas than usual.  Walking can help get rid of the air that was put into your GI tract during the procedure and reduce the bloating. If you had a lower endoscopy (such as a colonoscopy or flexible sigmoidoscopy) you may notice spotting of blood in your stool or on the toilet paper. If you underwent a bowel prep for your procedure, you may not have a normal bowel movement for a few days.  Please Note:  You might notice some irritation and congestion in your nose or some drainage.  This is from the oxygen used during your procedure.  There is no need for concern and it should clear up in a day or so.  SYMPTOMS TO REPORT IMMEDIATELY:   Following lower endoscopy (colonoscopy or flexible sigmoidoscopy):  Excessive amounts of blood in the stool  Significant tenderness or worsening of abdominal pains  Swelling of the abdomen that is new, acute  Fever of 100F or higher    For urgent or emergent issues, a gastroenterologist can be reached at any hour by calling [phone]. Do not use MyChart messaging for urgent concerns.    DIET:  We do recommend a small meal at first, but then you may proceed to your regular diet.  Drink plenty of fluids but you should avoid alcoholic beverages for 24 hours.  ACTIVITY:  You should plan to take it easy for the rest of today and you should NOT DRIVE or use heavy machinery until tomorrow  (because of the sedation medicines used during the test).    FOLLOW UP: Our staff will call the number listed on your records 48-72 hours following your procedure to check on you and address any questions or concerns that you may have regarding the information given to you following your procedure. If we do not reach you, we will leave a message.  We will attempt to reach you two times.  During this call, we will ask if you have developed any symptoms of COVID 19. If you develop any symptoms (ie: fever, flu-like symptoms, shortness of breath, cough etc.) before then, please call [phone].  If you test positive for Covid 19 in the 2 weeks post procedure, please call and report this information to us.    If any biopsies were taken you will be contacted by phone or by letter within the next 1-3 weeks.  Please call us at [phone] if you have not heard about the biopsies in 3 weeks.    SIGNATURES/CONFIDENTIALITY: You and/or your care partner have signed paperwork which will be entered into your electronic medical record.  These signatures attest to the fact that that the information above on your After Visit Summary has been reviewed and is understood.  Full responsibility of the confidentiality of this discharge information lies with you and/or your care-partner.   Resume medications. Information given on polyps and diverticulosis. 

## 2021-11-04 NOTE — Progress Notes (Signed)
CNW - VS  Pt's states no medical or surgical changes since previsit or office visit.

## 2021-11-06 ENCOUNTER — Telehealth: Payer: Self-pay

## 2021-11-06 NOTE — Telephone Encounter (Signed)
  Follow up Call-  Call back number 11/04/2021  Post procedure Call Back phone  # [phone] cell  Permission to leave phone message Yes  Some recent data might be hidden     Patient questions:  Do you have a fever, pain , or abdominal swelling? No. Pain Score  0 *  Have you tolerated food without any problems? Yes.    Have you been able to return to your normal activities? Yes.    Do you have any questions about your discharge instructions: Diet   No. Medications  No. Follow up visit  No.  Do you have questions or concerns about your Care? No.  Actions: * If pain score is 4 or above: No action needed, pain <4.

## 2021-11-07 ENCOUNTER — Encounter: Payer: Self-pay | Admitting: Internal Medicine

## 2022-05-28 DIAGNOSIS — Z719 Counseling, unspecified: Secondary | ICD-10-CM

## 2022-06-06 ENCOUNTER — Other Ambulatory Visit: Payer: Self-pay | Admitting: Internal Medicine

## 2022-06-06 DIAGNOSIS — Z1231 Encounter for screening mammogram for malignant neoplasm of breast: Secondary | ICD-10-CM

## 2022-07-24 ENCOUNTER — Ambulatory Visit: Payer: BC Managed Care – PPO

## 2022-07-29 ENCOUNTER — Ambulatory Visit
Admission: RE | Admit: 2022-07-29 | Discharge: 2022-07-29 | Disposition: A | Discharge status: Home / Self Care | Payer: BC Managed Care – PPO | Source: Ambulatory Visit | Attending: Internal Medicine | Admitting: Internal Medicine

## 2022-07-29 DIAGNOSIS — Z1231 Encounter for screening mammogram for malignant neoplasm of breast: Secondary | ICD-10-CM

## 2022-08-01 ENCOUNTER — Other Ambulatory Visit: Payer: Self-pay | Admitting: Internal Medicine

## 2022-08-01 DIAGNOSIS — R928 Other abnormal and inconclusive findings on diagnostic imaging of breast: Secondary | ICD-10-CM

## 2022-08-13 ENCOUNTER — Ambulatory Visit
Admission: RE | Admit: 2022-08-13 | Discharge: 2022-08-13 | Disposition: A | Discharge status: Home / Self Care | Payer: BC Managed Care – PPO | Source: Ambulatory Visit | Attending: Internal Medicine | Admitting: Internal Medicine

## 2022-08-13 DIAGNOSIS — R928 Other abnormal and inconclusive findings on diagnostic imaging of breast: Secondary | ICD-10-CM

## 2023-05-13 ENCOUNTER — Other Ambulatory Visit (HOSPITAL_BASED_OUTPATIENT_CLINIC_OR_DEPARTMENT_OTHER): Payer: Self-pay | Admitting: Obstetrics and Gynecology

## 2023-05-13 DIAGNOSIS — Z8249 Family history of ischemic heart disease and other diseases of the circulatory system: Secondary | ICD-10-CM

## 2023-05-28 DIAGNOSIS — Z719 Counseling, unspecified: Secondary | ICD-10-CM

## 2023-07-03 ENCOUNTER — Other Ambulatory Visit: Payer: Self-pay | Admitting: Internal Medicine

## 2023-07-03 DIAGNOSIS — Z1231 Encounter for screening mammogram for malignant neoplasm of breast: Secondary | ICD-10-CM

## 2023-07-14 ENCOUNTER — Ambulatory Visit (HOSPITAL_COMMUNITY)
Admission: RE | Admit: 2023-07-14 | Discharge: 2023-07-14 | Disposition: A | Discharge status: Home / Self Care | Payer: BC Managed Care – PPO | Source: Ambulatory Visit | Attending: Obstetrics and Gynecology | Admitting: Obstetrics and Gynecology

## 2023-07-14 DIAGNOSIS — Z8249 Family history of ischemic heart disease and other diseases of the circulatory system: Secondary | ICD-10-CM | POA: Insufficient documentation

## 2023-08-07 ENCOUNTER — Ambulatory Visit
Admission: RE | Admit: 2023-08-07 | Discharge: 2023-08-07 | Disposition: A | Discharge status: Home / Self Care | Payer: BC Managed Care – PPO | Source: Ambulatory Visit | Attending: Internal Medicine | Admitting: Internal Medicine

## 2023-08-07 DIAGNOSIS — Z1231 Encounter for screening mammogram for malignant neoplasm of breast: Secondary | ICD-10-CM

## 2023-10-06 ENCOUNTER — Telehealth: Payer: Self-pay | Admitting: Plastic Surgery

## 2023-10-06 NOTE — Telephone Encounter (Signed)
PLEASE KEEP THIS OPEN until some new Silagen comes in, pt wants to purchase, pt stated she does not see a provider here, she just comes to buy.

## 2023-10-19 NOTE — Telephone Encounter (Signed)
Called pt back to let her know we had some new scar cream in

## 2023-11-10 ENCOUNTER — Ambulatory Visit (INDEPENDENT_AMBULATORY_CARE_PROVIDER_SITE_OTHER): Payer: BC Managed Care – PPO

## 2023-11-10 ENCOUNTER — Ambulatory Visit: Payer: BC Managed Care – PPO | Admitting: Podiatry

## 2023-11-10 ENCOUNTER — Encounter: Payer: Self-pay | Admitting: Podiatry

## 2023-11-10 DIAGNOSIS — M778 Other enthesopathies, not elsewhere classified: Secondary | ICD-10-CM

## 2023-11-10 DIAGNOSIS — L84 Corns and callosities: Secondary | ICD-10-CM

## 2023-11-10 DIAGNOSIS — M205X1 Other deformities of toe(s) (acquired), right foot: Secondary | ICD-10-CM

## 2023-11-10 DIAGNOSIS — M79674 Pain in right toe(s): Secondary | ICD-10-CM

## 2023-11-11 NOTE — Progress Notes (Unsigned)
Subjective:   Patient ID: Alexa [name], female   DOB: 57 y.o.   MRN: [id number]   HPI Chief Complaint  Patient presents with   Foot Pain    RM#13 Right foot pain had previous bone spurs unable to wear closed toe shoes.    57 year old female presents the office with above concerns.  She previously had surgery in her right fifth toe however the pain started to come back.  She gets a corn on the side of her fifth toe, medial aspect that she trims down regularly.  No open lesions.  Areas tender with pressure, shoes.  No other concerns.  Review of Systems  All other systems reviewed and are negative.  Past Medical History:  Diagnosis Date   Anxiety    Hx of panic attack, resolved on medicines   Diabetes mellitus without complication ([facility])    Elevated LFTs    Fatty liver 03/27/2009   IBS (irritable bowel syndrome)    Liver lesion 2012   benign   Melanoma ([facility])    Melanoma ([facility]) 2011   Melanoma of left upper arm ([facility])    Resected, and no further RX. Dr. Campbell Stall   Migraines    SVT (supraventricular tachycardia) ([facility])    None since 2009    Past Surgical History:  Procedure Laterality Date   APPENDECTOMY     AUGMENTATION MAMMAPLASTY Bilateral 2007   saline implants pre- prectoral   BREAST BIOPSY Left 11/21/2014   BREAST SURGERY  2006   Breast implants   CESAREAN SECTION     two   LAPAROSCOPIC APPENDECTOMY  06/07/2012   Procedure: APPENDECTOMY LAPAROSCOPIC;  Surgeon: Ardeth Sportsman, MD;  Location: [facility];  Service: General;  Laterality: N/A;   melanoma resection     MOLE REMOVAL     multiple   TUBAL LIGATION       Current Outpatient Medications:    AJOVY 225 MG/1.5ML SOSY, , Disp: , Rfl:    ALPRAZolam (XANAX) 0.5 MG tablet, Take 1/2-1 tablet by mouth three times daily as needed for anxiety, Disp: , Rfl:    atenolol (TENORMIN) 25 MG tablet, , Disp: , Rfl:    BIOTIN PO, Take by mouth., Disp: , Rfl:    Calcium-Magnesium-Zinc 333-133-5 MG TABS, Take by mouth., Disp: ,  Rfl:    Cholecalciferol (VITAMIN D3) 1000 UNITS CAPS, Take 1 capsule by mouth daily. 4000 units, Disp: , Rfl:    Cyanocobalamin (VITAMIN B 12 PO), Take by mouth., Disp: , Rfl:    Diclofenac Potassium 50 MG PACK, Take 50 mg by mouth as needed., Disp: 15 each, Rfl: 6   escitalopram (LEXAPRO) 20 MG tablet, Take 10 mg by mouth daily. , Disp: , Rfl:    estradiol (VIVELLE-DOT) 0.05 MG/24HR patch, estradiol 0.05 mg/24 hr semiweekly transdermal patch  APPLY 1 PATCH ONTO THE SKIN TWICE A WEEK, Disp: , Rfl:    Flavoring Agent (PEPPERMINT FLAVOR) OIL, , Disp: , Rfl:    hydrOXYzine (ATARAX/VISTARIL) 25 MG tablet, hydroxyzine HCl 25 mg tablet  TAKE 1 TABLET BY MOUTH EVERY 6 HOURS AS NEEDED FOR SEVERE ITCHING, Disp: , Rfl:    Multiple Vitamin (MULTIVITAMIN) tablet, Take 1 tablet by mouth daily., Disp: , Rfl:    OZEMPIC, 1 MG/DOSE, 2 MG/1.5ML SOPN, , Disp: , Rfl:    Probiotic Product (PROBIOTIC PO), Take by mouth., Disp: , Rfl:    progesterone (PROMETRIUM) 100 MG capsule, progesterone micronized 100 mg capsule  TAKE 1 CAPSULE BY MOUTH EVERY  DAY AT BEDTIME, Disp: , Rfl:    Riboflavin (VITAMIN B-2 PO), Take 2 tablets by mouth daily as needed., Disp: , Rfl:    rosuvastatin (CRESTOR) 5 MG tablet, , Disp: , Rfl:    Semaglutide, 1 MG/DOSE, (OZEMPIC, 1 MG/DOSE,) 4 MG/3ML SOPN, , Disp: , Rfl:    SUMAtriptan (IMITREX) 100 MG tablet, Take 100 mg by mouth as needed., Disp: , Rfl:    UBRELVY 100 MG TABS, Take by mouth., Disp: , Rfl:    valACYclovir (VALTREX) 500 MG tablet, Take 500 mg by mouth as needed (Fever Blisters)., Disp: , Rfl:   Allergies  Allergen Reactions   Atorvastatin     Other reaction(s): abdominal pain   Celexa [Citalopram]     Other reaction(s): leg cramps   Zithromax [Azithromycin] Other (See Comments)    Thrush   Zolpidem Tartrate Er     Other reaction(s): "felt crazy"          Objective:  Physical Exam  General: AAO x3, NAD  Dermatological: Minimal hyperkeratotic lesion noted along  the medial aspect the right fifth toe as it is recently been shaved down.  There is no open lesions identified at this time.  Vascular: Dorsalis Pedis artery and Posterior Tibial artery pedal pulses are 2/4 bilateral with immedate capillary fill time.  There is no pain with calf compression, swelling, warmth, erythema.   Neruologic: Grossly intact via light touch bilateral.   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  She has tenderness palpation along the fifth digit on the left.  Hyperkeratotic lesion.  Gait: Unassisted, Nonantalgic.       Assessment:   Right fifth toe deformity, hyperkeratotic lesion     Plan:  -Treatment options discussed including all alternatives, risks, and complications -Etiology of symptoms were discussed -X-rays were obtained and reviewed with the patient.  3 views right foot were obtained.  No subacute fracture.  Rotation noted along the fifth digit and evidence of prior surgery. -We discussed both conservative as well as surgical treatment options.  Conservative discussed continuing to keep the callus trimmed down, filed as well as offloading pads and shoe modifications avoid excess pressure.  Surgically discussed with her exostectomy of the fifth toe, derotational skin plasty and likely exostectomy of the fourth digit as well.  We discussed the surgery as well as postoperative course.  She has been consider options  Vivi Barrack DPM

## 2024-05-06 ENCOUNTER — Telehealth: Payer: Self-pay | Admitting: Podiatry

## 2024-05-06 NOTE — Telephone Encounter (Signed)
 Patient wants to know estimate for surgery. Downtime as far as staying off feet, wearing a boot and going back to work.

## 2024-05-12 ENCOUNTER — Telehealth: Payer: Self-pay | Admitting: Podiatry

## 2024-05-12 NOTE — Telephone Encounter (Signed)
 Called pt since I had not heard back from her in my chart.  She has [employer] as of January and is to be emailing me the front and back of the card so I can forward it to the billing for the quote.

## 2024-06-08 NOTE — Telephone Encounter (Signed)
 Patient returned a call to discuss surgery. I informed the patient that you will return her call.  Thank you.

## 2024-06-08 NOTE — Telephone Encounter (Signed)
 Pt called back today and left message with one of the call center members.  I called back and she is scheduled for surgery on 06/29/24  She is on trulicity and is aware she has to be off for 1 wk prior to surgery. She is not on any blood thinners and does take asprin sometimes and I told her she would not need to take it 1 week prior to surgery. Pharmacy is correct in chart.

## 2024-06-13 ENCOUNTER — Encounter: Payer: Self-pay | Admitting: Podiatry

## 2024-06-15 ENCOUNTER — Telehealth: Payer: Self-pay | Admitting: Podiatry

## 2024-06-15 NOTE — Telephone Encounter (Signed)
 Pt scheduled to come in for a surgical consult on 7/25. She asked about not hearing from the surgery center as well and I told her they usually call 24 to 48 hrs prior to surgery date to tell her time she needs to arrive.

## 2024-06-17 ENCOUNTER — Telehealth: Payer: Self-pay | Admitting: Podiatry

## 2024-06-17 NOTE — Telephone Encounter (Signed)
 DOS- 06/29/2024  RT 5TH TOE HAMMERTOE REPAIR- 28285 RT 4TH TOE EXOSTECTOMY- 28108  AETNA STATE EFFECTIVE DATE- 12/02/2023  DEDUCTIBLE- $1250 REMAINING- $1250 OOP- $4890 REMAINING- $4024.75 COINSURANCE- 20%  PER [employer] AUTOMATED SYSTEM, NO PRIOR AUTHS ARE REQUIRED FOR CPT CODES 71714 AND 28108. REF# [id number] 06/16/2024

## 2024-06-24 ENCOUNTER — Ambulatory Visit: Admitting: Podiatry

## 2024-06-24 VITALS — Ht 69.0 in | Wt 185.0 lb

## 2024-06-24 DIAGNOSIS — M205X1 Other deformities of toe(s) (acquired), right foot: Secondary | ICD-10-CM | POA: Diagnosis not present

## 2024-06-24 DIAGNOSIS — L84 Corns and callosities: Secondary | ICD-10-CM | POA: Diagnosis not present

## 2024-06-24 NOTE — Patient Instructions (Signed)

## 2024-06-27 NOTE — Progress Notes (Signed)
 Subjective:   Patient ID: Alexa [name], female   DOB: 58 y.o.   MRN: [id number]   HPI Chief Complaint  Patient presents with   Foot Pain    RM 13 Patient is here for a surgical consult.     58 year old female presents the office with above concerns.  She is continue to pain most of the right fifth toe.  She is attempted conservative treatment clued shoe application, offloading padding that any significant improvement.         Objective:  Physical Exam  General: AAO x3, NAD  Dermatological:  Hyperkeratotic lesion noted along the medial aspect the right fifth toe. There is no open lesions identified at this time.  Vascular: Dorsalis Pedis artery and Posterior Tibial artery pedal pulses are 2/4 bilateral with immedate capillary fill time.  There is no pain with calf compression, swelling, warmth, erythema.   Neruologic: Grossly intact via light touch bilateral.   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  She has tenderness palpation along the fifth digit on the left.  Hyperkeratotic lesion.  Gait: Unassisted, Nonantalgic.       Assessment:   Right fifth toe deformity, hyperkeratotic lesion     Plan:  -Treatment options discussed including all alternatives, risks, and complications -Etiology of symptoms were discussed -Again reviewed the x-rays.  We discussed the conservative as well as surgical treatment options.  At this time she is attempted conservative treatment significant proved and she was proceed with surgical intervention. Surgically discussed with her exostectomy/arthroplasty of the fifth toe, derotational skin plasty and likely exostectomy of the fourth digit as well.  She wishes to proceed. -The incision placement as well as the postoperative course was discussed with the patient. I discussed risks of the surgery which include, but not limited to, infection, bleeding, pain, swelling, need for further surgery,  delayed or nonhealing, painful or ugly scar, numbness or sensation changes, over/under correction, recurrence, transfer lesions, further deformity, DVT/PE, loss of toe/foot. Patient understands these risks and wishes to proceed with surgery. The surgical consent was reviewed with the patient all 3 pages were signed. No promises or guarantees were given to the outcome of the procedure. All questions were answered to the best of my ability. Before the surgery the patient was encouraged to call the office if there is any further questions. The surgery will be performed at the [facility] on an outpatient basis.  Donnice JONELLE Fees DPM

## 2024-06-29 ENCOUNTER — Telehealth: Payer: Self-pay | Admitting: Podiatry

## 2024-06-29 ENCOUNTER — Other Ambulatory Visit: Payer: Self-pay | Admitting: Podiatry

## 2024-06-29 DIAGNOSIS — M25774 Osteophyte, right foot: Secondary | ICD-10-CM | POA: Diagnosis not present

## 2024-06-29 DIAGNOSIS — M2041 Other hammer toe(s) (acquired), right foot: Secondary | ICD-10-CM | POA: Diagnosis not present

## 2024-06-29 MED ORDER — HYDROCODONE-ACETAMINOPHEN 10-325 MG PO TABS: 1.0000 | ORAL_TABLET | ORAL | 0 refills | Status: AC | PRN

## 2024-06-29 MED ORDER — PROMETHAZINE HCL 25 MG PO TABS: 25.0000 mg | ORAL_TABLET | Freq: Three times a day (TID) | ORAL | 0 refills | Status: AC | PRN

## 2024-06-29 MED ORDER — CEPHALEXIN 500 MG PO CAPS: 500.0000 mg | ORAL_CAPSULE | Freq: Three times a day (TID) | ORAL | 0 refills | Status: AC

## 2024-06-29 MED ORDER — IBUPROFEN 800 MG PO TABS: 800.0000 mg | ORAL_TABLET | Freq: Three times a day (TID) | ORAL | 0 refills | Status: AC | PRN

## 2024-06-29 NOTE — Telephone Encounter (Signed)
Spoke to patient informed

## 2024-06-29 NOTE — Telephone Encounter (Signed)
 Spoke with patient she stated she took some ibuprofen  at 1:45 pm and will try alternating with tylenol  stated doesn't want to take the hydrocodone  unless it is necessary. No major pain at the moment is noticing her foot is waking up from surgery. Will satrt antibiotics and advised if pain worsens or gets a fever contact office back.

## 2024-06-29 NOTE — Telephone Encounter (Signed)
 Patient called and said that she had surgery this morning. She would like to know when she should start taking the medication that she was prescribed. She also wants to know if she should take tylenol  instead of the hydrocodone  because she has concerns about the side effects, and she doesn't want to take it unless it is necessary. Her phone number is [phone].

## 2024-07-01 ENCOUNTER — Other Ambulatory Visit: Payer: Self-pay | Admitting: Obstetrics and Gynecology

## 2024-07-01 DIAGNOSIS — Z1231 Encounter for screening mammogram for malignant neoplasm of breast: Secondary | ICD-10-CM

## 2024-07-04 ENCOUNTER — Ambulatory Visit (INDEPENDENT_AMBULATORY_CARE_PROVIDER_SITE_OTHER): Admitting: Podiatry

## 2024-07-04 ENCOUNTER — Ambulatory Visit (INDEPENDENT_AMBULATORY_CARE_PROVIDER_SITE_OTHER)

## 2024-07-04 DIAGNOSIS — M205X1 Other deformities of toe(s) (acquired), right foot: Secondary | ICD-10-CM

## 2024-07-05 NOTE — Progress Notes (Signed)
 Subjective: Chief Complaint  Patient presents with   Routine Post Op    POV # 1 DOS 06/29/24 RT 5TH HAMMERTOE REPAIR, RT 4TH TOE EXOSTECTOMY  58 year old female presents today with her husband for follow-up evaluation status post the above procedures.  States that she has been doing well.  Her pain is controlled.  She does not report any fevers or chills.  Objective: AAO x3, NAD-wearing surgical shoe DP/PT pulses palpable bilaterally, CRT less than 3 seconds Incisions are well coapted with sutures intact.  There is minimal edema.  There is no surrounding erythema.  There is no drainage or pus.  There is no signs of infection.  Fifth toe is rectus. No pain with calf compression, swelling, warmth, erythema  Assessment: Status post right foot surgery  Plan: -All treatment options discussed with the patient including all alternatives, risks, complications.  -X-rays obtained reviewed the right foot.  Multiple views obtained.  No evidence of acute fracture.  Status post arthroplasty of fifth digit. -Incisions healing well.  Small amount of antibiotic was applied followed by dressing.  She keep the dressing clean, dry, intact until follow-up. -Continue ice, elevate. -Surgical shoe -Monitor for any clinical signs or symptoms of infection and directed to call the office immediately should any occur or go to the ER. -Patient encouraged to call the office with any questions, concerns, change in symptoms.   Donnice JONELLE Fees DPM

## 2024-07-14 ENCOUNTER — Encounter: Payer: Self-pay | Admitting: Podiatry

## 2024-07-14 ENCOUNTER — Ambulatory Visit (INDEPENDENT_AMBULATORY_CARE_PROVIDER_SITE_OTHER): Admitting: Podiatry

## 2024-07-14 VITALS — Ht 69.0 in | Wt 185.0 lb

## 2024-07-14 DIAGNOSIS — M205X1 Other deformities of toe(s) (acquired), right foot: Secondary | ICD-10-CM

## 2024-07-15 ENCOUNTER — Other Ambulatory Visit: Payer: Self-pay | Admitting: Nurse Practitioner

## 2024-07-15 DIAGNOSIS — K760 Fatty (change of) liver, not elsewhere classified: Secondary | ICD-10-CM

## 2024-07-18 NOTE — Progress Notes (Signed)
 Subjective: Chief Complaint  Patient presents with   Routine Post Op    POV # 2 DOS 06/29/24 RT 5TH HAMMERTOE REPAIR, RT 4TH EXOSECTOMY, still some moderate pain, no drainage.     58 year old female presents today with her husband for follow-up evaluation status post the above procedures.  States that she has been doing well.  She presents today for suture removal.  States that she was doing well otherwise.  No fevers or chills.  Objective: AAO x3, NAD-wearing surgical shoe DP/PT pulses palpable bilaterally, CRT less than 3 seconds Incisions are well coapted with sutures intact.  There is minimal edema, improving.  There is no surrounding erythema.  There is no drainage or pus.  There is no signs of infection.  Fifth toe is rectus. No pain with calf compression, swelling, warmth, erythema  Assessment: Status post right foot surgery  Plan: -All treatment options discussed with the patient including all alternatives, risks, complications.  -Sutures removed complications.  Significantly well.  Steri-Strips applied for reinforcement followed by bandage.  Discussed that she can wash the foot with soap and water, dry thoroughly. -Plan surgical shoe.  Ice, elevation -Pain medication as needed -Monitor for any clinical signs or symptoms of infection and directed to call the office immediately should any occur or go to the ER.  Return in about 2 weeks (around 07/28/2024) for post-op. x-ray.  Alexa [name] DPM

## 2024-07-21 ENCOUNTER — Inpatient Hospital Stay
Admission: RE | Admit: 2024-07-21 | Discharge: 2024-07-21 | Discharge status: Home / Self Care | Payer: Self-pay | Source: Ambulatory Visit | Attending: Nurse Practitioner

## 2024-07-21 DIAGNOSIS — K760 Fatty (change of) liver, not elsewhere classified: Secondary | ICD-10-CM

## 2024-07-28 ENCOUNTER — Encounter: Admitting: Podiatry

## 2024-07-28 ENCOUNTER — Ambulatory Visit (INDEPENDENT_AMBULATORY_CARE_PROVIDER_SITE_OTHER): Admitting: Podiatry

## 2024-07-28 ENCOUNTER — Encounter: Payer: Self-pay | Admitting: Podiatry

## 2024-07-28 ENCOUNTER — Ambulatory Visit (INDEPENDENT_AMBULATORY_CARE_PROVIDER_SITE_OTHER)

## 2024-07-28 VITALS — Ht 69.0 in | Wt 185.0 lb

## 2024-07-28 DIAGNOSIS — M205X1 Other deformities of toe(s) (acquired), right foot: Secondary | ICD-10-CM | POA: Diagnosis not present

## 2024-07-28 NOTE — Progress Notes (Signed)
 Subjective: Chief Complaint  Patient presents with   Routine Post Op    POV # 3 DOS 06/29/24 RT 5TH HAMMERTOE REPAIR, RT 4TH EXOSECTOMY, pt is here to f/u on right foot she states that everything is going well has no complaints.       58 year old female presents today with her husband for follow-up evaluation status post the above procedures.  States that this week she has been doing well she is back to wearing her regular sandal.  She is in the surgical shoe actually her foot hurt more.  Still some swelling but overall she is not having really any significant pain or issues.    Objective: AAO x3, NAD-wearing surgical shoe DP/PT pulses palpable bilaterally, CRT less than 3 seconds Incisions are well coapted and scar is formed.  There is still some edema present to the fifth toe.  There is no tenderness palpation.  There is no erythema or warmth.  Toes are rectus.  No callus formation. No pain with calf compression, swelling, warmth, erythema  Assessment: Status post right foot surgery  Plan: -All treatment options discussed with the patient including all alternatives, risks, complications.  -X-rays obtained reviewed.  Status post arthroplasty of the fifth toe.  No other acute fracture. -She is been doing well she is back to wearing a sandal.  She will tape the toe and also she can split this at the fourth toe with Coflex to buddy splint it.  -Continue ice, elevate to help with any residual edema. -Gradual transition to a sneaker as tolerated. -At this time I am going to see her back on a as-needed basis and she agrees with this plan.  She is well aware she can contact any concerns or questions or any changes.  Return if symptoms worsen or fail to improve.  Donnice JONELLE Fees DPM

## 2024-08-12 ENCOUNTER — Ambulatory Visit: Payer: Self-pay

## 2024-08-15 ENCOUNTER — Ambulatory Visit
Admission: RE | Admit: 2024-08-15 | Discharge: 2024-08-15 | Disposition: A | Discharge status: Home / Self Care | Payer: Self-pay | Source: Ambulatory Visit | Attending: Obstetrics and Gynecology | Admitting: Obstetrics and Gynecology

## 2024-08-15 DIAGNOSIS — Z1231 Encounter for screening mammogram for malignant neoplasm of breast: Secondary | ICD-10-CM
